# Patient Record
Sex: Male | Born: 1955 | Race: White | Hispanic: No | Marital: Single | Smoking: Former smoker
Health system: Southern US, Community
[De-identification: ages and names within clinical notes are randomized; demographics above are authoritative.]

## PROBLEM LIST (undated history)

## (undated) DIAGNOSIS — G4700 Insomnia, unspecified: Secondary | ICD-10-CM

## (undated) DIAGNOSIS — F909 Attention-deficit hyperactivity disorder, unspecified type: Secondary | ICD-10-CM

## (undated) DIAGNOSIS — F1020 Alcohol dependence, uncomplicated: Secondary | ICD-10-CM

## (undated) DIAGNOSIS — I10 Essential (primary) hypertension: Secondary | ICD-10-CM

## (undated) HISTORY — PX: KNEE SURGERY: SHX244

## (undated) HISTORY — DX: Alcohol dependence, uncomplicated: F10.20

## (undated) HISTORY — DX: Attention-deficit hyperactivity disorder, unspecified type: F90.9

## (undated) HISTORY — DX: Insomnia, unspecified: G47.00

## (undated) HISTORY — DX: Essential (primary) hypertension: I10

---

## 2012-10-15 ENCOUNTER — Emergency Department: Payer: Self-pay | Admitting: Emergency Medicine

## 2012-10-15 LAB — COMPREHENSIVE METABOLIC PANEL
Alkaline Phosphatase: 87 U/L (ref 50–136)
Anion Gap: 8 (ref 7–16)
BUN: 10 mg/dL (ref 7–18)
Chloride: 105 mmol/L (ref 98–107)
Co2: 24 mmol/L (ref 21–32)
Creatinine: 0.86 mg/dL (ref 0.60–1.30)
EGFR (African American): >60
EGFR (Non-African Amer.): >60
Glucose: 96 mg/dL (ref 65–99)
Osmolality: 273 (ref 275–301)
Potassium: 3.8 mmol/L (ref 3.5–5.1)
SGOT(AST): 81 U/L — ABNORMAL HIGH (ref 15–37)
SGPT (ALT): 70 U/L (ref 12–78)
Sodium: 137 mmol/L (ref 136–145)
Total Protein: 7 g/dL (ref 6.4–8.2)

## 2012-10-15 LAB — CBC
MCV: 99 fL (ref 80–100)
RBC: 4.06 10*6/uL — ABNORMAL LOW (ref 4.40–5.90)
RDW: 13.1 % (ref 11.5–14.5)
WBC: 3.3 10*3/uL — ABNORMAL LOW (ref 3.8–10.6)

## 2015-05-16 ENCOUNTER — Ambulatory Visit: Payer: Self-pay | Admitting: Family Medicine

## 2015-05-19 ENCOUNTER — Encounter: Payer: Self-pay | Admitting: Family Medicine

## 2015-05-19 ENCOUNTER — Ambulatory Visit (INDEPENDENT_AMBULATORY_CARE_PROVIDER_SITE_OTHER): Payer: Self-pay | Admitting: Family Medicine

## 2015-05-19 VITALS — BP 130/80 | HR 62 | Temp 98.0°F | Resp 16 | Ht 71.0 in | Wt 178.0 lb

## 2015-05-19 DIAGNOSIS — I10 Essential (primary) hypertension: Secondary | ICD-10-CM

## 2015-05-19 DIAGNOSIS — F1020 Alcohol dependence, uncomplicated: Secondary | ICD-10-CM | POA: Insufficient documentation

## 2015-05-19 DIAGNOSIS — F909 Attention-deficit hyperactivity disorder, unspecified type: Secondary | ICD-10-CM | POA: Insufficient documentation

## 2015-05-19 DIAGNOSIS — G4700 Insomnia, unspecified: Secondary | ICD-10-CM | POA: Insufficient documentation

## 2015-05-19 MED ORDER — METOPROLOL TARTRATE 25 MG PO TABS: 25.0000 mg | ORAL_TABLET | Freq: Two times a day (BID) | ORAL | Status: AC

## 2015-05-19 MED ORDER — LORAZEPAM 1 MG PO TABS: 1.0000 mg | ORAL_TABLET | Freq: Four times a day (QID) | ORAL | Status: DC | PRN

## 2015-05-19 NOTE — Progress Notes (Signed)
Patient: Bryan Proctor Male    DOB: 1955/11/27   59 y.o.   MRN: [id number] Visit Date: 05/19/2015  Today's Provider: Mila Merry, MD   Chief Complaint  Patient presents with  . Follow-up    4 month  . Hypertension  . Insomnia   Subjective:    HPI   Follow-up for Insomnia from 01/10/2015; no changes were made.   Hypertension, follow-up:  BP Readings from Last 3 Encounters:  05/19/15 130/80  01/10/15 124/80    He was last seen for hypertension 4 months ago.  BP at that visit was 124/80. Management changes since that visit include none. He reports good compliance with treatment. He is not having side effects. none  He is exercising. He is adherent to low salt diet.   Outside blood pressures are none. He is experiencing none.  Patient denies none.   Cardiovascular risk factors include none.  Use of agents associated with hypertension: none.     Weight trend: stable Wt Readings from Last 3 Encounters:  05/19/15 178 lb (80.74 kg)  01/10/15 186 lb (84.369 kg)    Current diet: well balanced   Alcohol abuse, follow up  He states he has been mostly sober since his last visit in April. He states he did not drink at all for about a month long period, but on several occasions drink 3-4 tall beers on the weekends. He has not attended any alcohol abuse support groups lately. He does not lorazepam most nights to help him sleep, but not when he has been drinking.       Allergies  Allergen Reactions  . Penicillins Nausea And Vomiting   Previous Medications   LORAZEPAM (ATIVAN) 1 MG TABLET    Take 1 mg by mouth every 6 (six) hours as needed for anxiety (for insomnia, nervousness, and elevated blood pressure).   METOPROLOL TARTRATE (LOPRESSOR) 25 MG TABLET    Take 25 mg by mouth 2 (two) times daily.    Review of Systems  Constitutional: Negative for fatigue.  Cardiovascular: Positive for leg swelling. Negative for chest pain and palpitations.  Neurological:  Negative for dizziness, light-headedness and headaches.  Psychiatric/Behavioral: Positive for sleep disturbance.    Social History  Substance Use Topics  . Smoking status: Former Games developer  . Smokeless tobacco: Not on file  . Alcohol Use: 0.0 oz/week    0 Standard drinks or equivalent per week     Comment: Previous Heavy alcohol use, drank 6-12 beers daily. Quit drinking 01/04/2015   Objective:   BP 130/80 mmHg  Pulse 62  Temp(Src) 98 F (36.7 C) (Oral)  Resp 16  Ht 5\' 11"  (1.803 m)  Wt 178 lb (80.74 kg)  BMI 24.84 kg/m2  Physical Exam  General appearance: alert, well developed, well nourished, cooperative and in no distress Head: Normocephalic, without obvious abnormality, atraumatic Lungs: Respirations even and unlabored Extremities: No gross deformities Skin: Skin color, texture, turgor normal. No rashes seen  Psych: Appropriate mood and affect. Neurologic: Mental status: Alert, oriented to person, place, and time, thought content appropriate.     Assessment & Plan:     1. Alcoholism He states he will get back into AA meetings  2. Essential hypertension Metoprolol working fairly well so long as he remains abstinent of alcohol.  - metoprolol tartrate (LOPRESSOR) 25 MG tablet; Take 1 tablet (25 mg total) by mouth 2 (two) times daily.  Dispense: 60 tablet; Refill: 5  3. Insomnia  -  LORazepam (ATIVAN) 1 MG tablet; Take 1 tablet (1 mg total) by mouth every 6 (six) hours as needed for anxiety (for insomnia, nervousness, and elevated blood pressure).  Dispense: 30 tablet; Refill: 3        Mila Merry, MD  [facility] 130/80

## 2015-10-17 ENCOUNTER — Telehealth: Payer: Self-pay

## 2015-10-17 NOTE — Telephone Encounter (Signed)
Patient called to make an appointment for next week for leg pain, swelling and rash present lower right leg. Patient states this has been going on for 2 weeks, recurrent, not worse but does not look the best where the swelling is and rash. Advised patient he needs to be seen today to get this evaluated before it causes further issues. Patient states he has things to do today and then leaving to go out of town this afternoon. Explained to him my concern but he still wants to wait till next week. Advised patient he needs to seek treatment if symptoms become worse sooner and he understood.-aa

## 2015-10-22 ENCOUNTER — Ambulatory Visit (INDEPENDENT_AMBULATORY_CARE_PROVIDER_SITE_OTHER): Payer: Self-pay | Admitting: Family Medicine

## 2015-10-22 ENCOUNTER — Encounter: Payer: Self-pay | Admitting: Family Medicine

## 2015-10-22 VITALS — BP 118/80 | HR 62 | Temp 98.6°F | Resp 16 | Wt 168.0 lb

## 2015-10-22 DIAGNOSIS — R600 Localized edema: Secondary | ICD-10-CM

## 2015-10-22 NOTE — Progress Notes (Signed)
       Patient: Bryan Proctor Male    DOB: 05-10-1956   60 y.o.   MRN: [id number] Visit Date: 10/22/2015  Today's Provider: Mila Merry, MD   Chief Complaint  Patient presents with  . Leg Swelling    x 2 months   Subjective:    HPI  Leg Swelling:  Patient comes in stating he has had swelling in both legs for several months. Patient feels the swelling is worse in the left leg and if overall worsening. Patient reports pain in his feet and numbness on the bottom of his feet. Patient has tried elevating his legs while watching TV. Patient states a rash appeared on his left leg 2-3 weeks ago. Rash was painful at first, but not now. He denies any chest pains or shortness of breath, but has been very fatigued lately.  He states he has mostly stopped drinking alcohol. He drink half a beer last weekend but wasn't able to hold it down. Before that, the last time he drank alcohol was new years eve.     Allergies  Allergen Reactions  . Penicillins Nausea And Vomiting   Previous Medications   LORAZEPAM (ATIVAN) 1 MG TABLET    Take 1 tablet (1 mg total) by mouth every 6 (six) hours as needed for anxiety (for insomnia, nervousness, and elevated blood pressure).   METOPROLOL TARTRATE (LOPRESSOR) 25 MG TABLET    Take 1 tablet (25 mg total) by mouth 2 (two) times daily.    Review of Systems  Constitutional: Negative for fever, chills, diaphoresis, appetite change and fatigue.  Respiratory: Negative for chest tightness, shortness of breath and wheezing.   Cardiovascular: Negative for chest pain and palpitations.  Gastrointestinal: Negative for nausea, vomiting and abdominal pain.  Neurological: Positive for light-headedness and numbness (at the bottom of his feet). Negative for dizziness and facial asymmetry.    Social History  Substance Use Topics  . Smoking status: Former Games developer  . Smokeless tobacco: Not on file  . Alcohol Use: 0.0 oz/week    0 Standard drinks or equivalent per week   Comment: Previous Heavy alcohol use, drank 6-12 beers daily. Quit drinking 01/04/2015.    Objective:   BP 118/80 mmHg  Pulse 62  Temp(Src) 98.6 F (37 C) (Oral)  Resp 16  Wt 168 lb (76.204 kg)  SpO2 97%  Physical Exam   General Appearance:    Alert, cooperative, no distress  Eyes:    PERRL, conjunctiva/corneas clear, EOM's intact       Lungs:     Clear to auscultation bilaterally, respirations unlabored  Heart:    Regular rate and rhythm, no murmurs rubs or gallops.   Neurologic:   Awake, alert, oriented x 3. No apparent focal neurological           defect.   Extremities:   3+ pitting edema left foot and ankle, 2+ on right. No erythema. Not warm to touch. No other lesions. Few minor varicosities.        Assessment & Plan:     1. Localized edema May be exacerbated by beta-blocker. No s/s of cardiac dysfunction.  Check labs.   - Comprehensive metabolic panel - CBC - TSH       Mila Merry, MD  [facility]

## 2015-10-23 ENCOUNTER — Telehealth: Payer: Self-pay

## 2015-10-23 DIAGNOSIS — R609 Edema, unspecified: Secondary | ICD-10-CM

## 2015-10-23 LAB — CBC
Hematocrit: 35.7 % — ABNORMAL LOW (ref 37.5–51.0)
Hemoglobin: 12 g/dL — ABNORMAL LOW (ref 12.6–17.7)
MCH: 34.1 pg — ABNORMAL HIGH (ref 26.6–33.0)
MCHC: 33.6 g/dL (ref 31.5–35.7)
MCV: 101 fL — ABNORMAL HIGH (ref 79–97)
PLATELETS: 172 10*3/uL (ref 150–379)
RBC: 3.52 x10E6/uL — ABNORMAL LOW (ref 4.14–5.80)
RDW: 14.9 % (ref 12.3–15.4)
WBC: 3.7 10*3/uL (ref 3.4–10.8)

## 2015-10-23 LAB — COMPREHENSIVE METABOLIC PANEL
ALT: 91 IU/L — ABNORMAL HIGH (ref 0–44)
AST: 146 IU/L — ABNORMAL HIGH (ref 0–40)
Albumin/Globulin Ratio: 1.1 (ref 1.1–2.5)
Albumin: 3.4 g/dL — ABNORMAL LOW (ref 3.5–5.5)
Alkaline Phosphatase: 166 IU/L — ABNORMAL HIGH (ref 39–117)
BILIRUBIN TOTAL: 1.1 mg/dL (ref 0.0–1.2)
BUN/Creatinine Ratio: 4 — ABNORMAL LOW (ref 9–20)
BUN: 4 mg/dL — ABNORMAL LOW (ref 6–24)
CHLORIDE: 94 mmol/L — AB (ref 96–106)
CO2: 25 mmol/L (ref 18–29)
Calcium: 9.1 mg/dL (ref 8.7–10.2)
Creatinine, Ser: 0.89 mg/dL (ref 0.76–1.27)
GFR calc Af Amer: 109 mL/min/{1.73_m2} (ref >59)
GFR calc non Af Amer: 94 mL/min/{1.73_m2} (ref >59)
GLUCOSE: 122 mg/dL — AB (ref 65–99)
Globulin, Total: 3.1 g/dL (ref 1.5–4.5)
POTASSIUM: 3.7 mmol/L (ref 3.5–5.2)
SODIUM: 134 mmol/L (ref 134–144)
TOTAL PROTEIN: 6.5 g/dL (ref 6.0–8.5)

## 2015-10-23 LAB — TSH: TSH: 1.47 u[IU]/mL (ref 0.450–4.500)

## 2015-10-23 NOTE — Telephone Encounter (Signed)
Left message to call back  

## 2015-10-23 NOTE — Telephone Encounter (Signed)
-----   Message from Malva Limes, MD sent at 10/23/2015  7:47 AM EST ----- Liver functions are elevated, blood cell count is low, and albumin levels are low. These are all signs of alcoholic liver disease, and cause fluid retention and swelling. He needs to stop drinking alcohol completely. He needs to start thiamine OTC  daily. OTC Folic acid 1 milligram daily. For swelling can take Rx spironolactone  once a day as needed, #30, rf x 1. Follow up office visit in 2-3 weeks.

## 2015-10-23 NOTE — Telephone Encounter (Signed)
LMOVM for pt to return call 

## 2015-10-29 NOTE — Telephone Encounter (Signed)
LMTCB

## 2015-10-30 MED ORDER — SPIRONOLACTONE 50 MG PO TABS: 50.0000 mg | ORAL_TABLET | Freq: Every day | ORAL | Status: DC | PRN

## 2015-10-30 NOTE — Telephone Encounter (Signed)
Patient advised as below and verbally voiced understanding. Prescription sent into pharmacy. Patient states he will call back to schedule follow up appointment.

## 2015-11-17 ENCOUNTER — Encounter: Payer: Self-pay | Admitting: Family Medicine

## 2015-11-17 ENCOUNTER — Ambulatory Visit (INDEPENDENT_AMBULATORY_CARE_PROVIDER_SITE_OTHER): Payer: Self-pay | Admitting: Family Medicine

## 2015-11-17 VITALS — BP 120/80 | HR 89 | Temp 98.7°F | Resp 16 | Ht 71.0 in | Wt 161.0 lb

## 2015-11-17 DIAGNOSIS — K709 Alcoholic liver disease, unspecified: Secondary | ICD-10-CM

## 2015-11-17 DIAGNOSIS — G4700 Insomnia, unspecified: Secondary | ICD-10-CM

## 2015-11-17 MED ORDER — DIAZEPAM 5 MG PO TABS: 5.0000 mg | ORAL_TABLET | Freq: Every evening | ORAL | Status: AC | PRN

## 2015-11-17 NOTE — Patient Instructions (Addendum)
Do not ever drink alcohol   Take  thiamine vitamin every day   You should start going to support group such as alcoholic's anonymous   Alcohol Abuse and Nutrition Alcohol abuse is any pattern of alcohol consumption that harms your health, relationships, or work. Alcohol abuse can affect how your body breaks down and absorbs nutrients from food by causing your liver to work abnormally. Additionally, many people who abuse alcohol do not eat enough carbohydrates, protein, fat, vitamins, and minerals. This can cause poor nutrition (malnutrition) and a lack of nutrients (nutrient deficiencies), which can lead to further complications. Nutrients that are commonly lacking (deficient) among people who abuse alcohol include:  Vitamins.  Vitamin A. This is stored in your liver. It is important for your vision, metabolism, and ability to fight off infections (immunity).  B vitamins. These include vitamins such as folate, thiamin, and niacin. These are important in new cell growth and maintenance.  Vitamin C. This plays an important role in iron absorption, wound healing, and immunity.  Vitamin D. This is produced by your liver, but you can also get vitamin D from food. Vitamin D is necessary for your body to absorb and use calcium.  Minerals.  Calcium. This is important for your bones and your heart and blood vessel (cardiovascular) function.  Iron. This is important for blood, muscle, and nervous system functioning.  Magnesium. This plays an important role in muscle and nerve function, and it helps to control blood sugar and blood pressure.  Zinc. This is important for the normal function of your nervous system and digestive system (gastrointestinal tract). Nutrition is an essential component of therapy for alcohol abuse. Your health care provider or dietitian will work with you to design a plan that can help restore nutrients to your body and prevent potential complications. WHAT IS MY  PLAN? Your dietitian may develop a specific diet plan that is based on your condition and any other complications you may have. A diet plan will commonly include:  A balanced diet.  Grains: 6-8 oz per day.  Vegetables: 2-3 cups per day.  Fruits: 1-2 cups per day.  Meat and other protein: 5-6 oz per day.  Dairy: 2-3 cups per day.  Vitamin and mineral supplements. WHAT DO I NEED TO KNOW ABOUT ALCOHOL AND NUTRITION?  Consume foods that are high in antioxidants, such as grapes, berries, nuts, green tea, and dark green and orange vegetables. This can help to counteract some of the stress that is placed on your liver by consuming alcohol.  Avoid food and drinks that are high in fat and sugar. Foods such as sugared soft drinks, salty snack foods, and candy contain empty calories. This means that they lack important nutrients such as protein, fiber, and vitamins.  Eat frequent meals and snacks. Try to eat 5-6 small meals each day.  Eat a variety of fresh fruits and vegetables each day. This will help you get plenty of water, fiber, and vitamins in your diet.  Drink plenty of water and other clear fluids. Try to drink at least 48-64 oz (1.5-2 L) of water per day.  If you are a vegetarian, eat a variety of protein-rich foods. Pair whole grains with plant-based proteins at meals and snacks to obtain the greatest nutrient benefit from your food. For example, eat rice with beans, put peanut butter on whole-grain toast, or eat oatmeal with sunflower seeds.  Soak beans and whole grains overnight before cooking. This can help your body to  absorb the nutrients more easily.  Include foods fortified with vitamins and minerals in your diet. Commonly fortified foods include milk, orange juice, cereal, and bread.  If you are malnourished, your dietitian may recommend a high-protein, high-calorie diet. This may include:  2,000-3,000 calories (kilocalories) per day.  70-100 grams of protein per  day.  Your health care provider may recommend a complete nutritional supplement beverage. This can help to restore calories, protein, and vitamins to your body. Depending on your condition, you may be advised to consume this instead of or in addition to meals.  Limit your intake of caffeine. Replace drinks like coffee and black tea with decaffeinated coffee and herbal tea.  Eat a variety of foods that are high in omega fatty acids. These include fish, nuts and seeds, and soybeans. These foods may help your liver to recover and may also stabilize your mood.  Certain medicines may cause changes in your appetite, taste, and weight. Work with your health care provider and dietitian to make any adjustments to your medicines and diet plan.  Include other healthy lifestyle choices in your daily routine.  Be physically active.  Get enough sleep.  Spend time doing activities that you enjoy.  If you are unable to take in enough food and calories by mouth, your health care provider may recommend a feeding tube. This is a tube that passes through your nose and throat, directly into your stomach. Nutritional supplement beverages can be given to you through the feeding tube to help you get the nutrients you need.  Take vitamin or mineral supplements as recommended by your health care provider. WHAT FOODS CAN I EAT? Grains Enriched pasta. Enriched rice. Fortified whole-grain bread. Fortified whole-grain cereal. Barley. Brown rice. Quinoa. Millet. Vegetables All fresh, frozen, and canned vegetables. Spinach. Kale. Artichoke. Carrots. Winter squash and pumpkin. Sweet potatoes. Broccoli. Cabbage. Cucumbers. Tomatoes. Sweet peppers. Green beans. Peas. Corn. Fruits All fresh and frozen fruits. Berries. Grapes. Mango. Papaya. Guava. Cherries. Apples. Bananas. Peaches. Plums. Pineapple. Watermelon. Cantaloupe. Oranges. Avocado. Meats and Other Protein Sources Beef liver. Lean beef. Pork. Fresh and canned  chicken. Fresh fish. Oysters. Sardines. Canned tuna. Shrimp. Eggs with yolks. Nuts and seeds. Peanut butter. Beans and lentils. Soybeans. Tofu. Dairy Whole, low-fat, and nonfat milk. Whole, low-fat, and nonfat yogurt. Cottage cheese. Sour cream. Hard and soft cheeses. Beverages Water. Herbal tea. Decaffeinated coffee. Decaffeinated green tea. 100% fruit juice. 100% vegetable juice. Instant breakfast shakes. Condiments Ketchup. Mayonnaise. Mustard. Salad dressing. Barbecue sauce. Sweets and Desserts Sugar-free ice cream. Sugar-free pudding. Sugar-free gelatin. Fats and Oils Butter. Vegetable oil, flaxseed oil, olive oil, and walnut oil. Other Complete nutrition shakes. Protein bars. Sugar-free gum. The items listed above may not be a complete list of recommended foods or beverages. Contact your dietitian for more options. WHAT FOODS ARE NOT RECOMMENDED? Grains Sugar-sweetened breakfast cereals. Flavored instant oatmeal. Fried breads. Vegetables Breaded or deep-fried vegetables. Fruits Dried fruit with added sugar. Candied fruit. Canned fruit in syrup. Meats and Other Protein Sources Breaded or deep-fried meats. Dairy Flavored milks. Fried cheese curds or fried cheese sticks. Beverages Alcohol. Sugar-sweetened soft drinks. Sugar-sweetened tea. Caffeinated coffee and tea. Condiments Sugar. Honey. Agave nectar. Molasses. Sweets and Desserts Chocolate. Cake. Cookies. Candy. Other Potato chips. Pretzels. Salted nuts. Candied nuts. The items listed above may not be a complete list of foods and beverages to avoid. Contact your dietitian for more information.   This information is not intended to replace advice given to you by your health  care provider. Make sure you discuss any questions you have with your health care provider.   Document Released: 07/15/2005 Document Revised: 10/11/2014 Document Reviewed: 04/23/2014 Elsevier Interactive Patient Education [employer]

## 2015-11-17 NOTE — Progress Notes (Signed)
Patient: Bryan Proctor Male    DOB: 1956-09-28   60 y.o.   MRN: [id number] Visit Date: 11/17/2015  Today's Provider: Mila Merry, MD   Chief Complaint  Patient presents with  . Follow-up  . Leg Swelling   Subjective:    HPI  Follow-up for edema from 10/22/2015; labs ordered. Showing liver functions elevated, blood cell count is low, and albumin levels are low Patient advised to stop drinking alcohol completely. Patient to start thiamine OTC  daily. OTC Folic acid 1 milligram daily and spironolactone  once a day as needed, but he did not pick up prescription and has not started vitamins. He states he has not had any alcohol for the last 2 weeks and swelling has since resolved.   CMP Latest Ref Rng 10/22/2015 10/15/2012  Glucose 65 - 99 mg/dL 409(W) 96  BUN 6 - 24 mg/dL 4(L) 10  Creatinine 1.19 - 1.27 mg/dL 1.47 8.29  Sodium 562 - 144 mmol/L 134 137  Potassium 3.5 - 5.2 mmol/L 3.7 3.8  Chloride 96 - 106 mmol/L 94(L) 105  CO2 18 - 29 mmol/L 25 24  Calcium 8.7 - 10.2 mg/dL 9.1 8.8  Total Protein 6.0 - 8.5 g/dL 6.5 7.0  Total Bilirubin 0.0 - 1.2 mg/dL 1.1 0.3  Alkaline Phos 39 - 117 IU/L 166(H) 87  AST 0 - 40 IU/L 146(H) 81(H)  ALT 0 - 44 IU/L 91(H) 70     Allergies  Allergen Reactions  . Penicillins Nausea And Vomiting   Previous Medications   FOLIC ACID (FOLVITE) 1 MG TABLET    Take 1 mg by mouth daily. Reported on 11/17/2015   LORAZEPAM (ATIVAN) 1 MG TABLET    Take 1 tablet (1 mg total) by mouth every 6 (six) hours as needed for anxiety (for insomnia, nervousness, and elevated blood pressure).   METOPROLOL TARTRATE (LOPRESSOR) 25 MG TABLET    Take 1 tablet (25 mg total) by mouth 2 (two) times daily.   SPIRONOLACTONE (ALDACTONE) 50 MG TABLET    Take 1 tablet (50 mg total) by mouth daily as needed.   THIAMINE 100 MG TABLET    Take 100 mg by mouth daily. Reported on 11/17/2015    Review of Systems  Constitutional: Negative for fever, chills and appetite change.    Respiratory: Negative for chest tightness, shortness of breath and wheezing.   Cardiovascular: Positive for leg swelling. Negative for chest pain and palpitations.  Gastrointestinal: Negative for nausea, vomiting and abdominal pain.  Psychiatric/Behavioral: Positive for sleep disturbance.       Having trouble falling asleep and staying asleep    Social History  Substance Use Topics  . Smoking status: Former Games developer  . Smokeless tobacco: Not on file  . Alcohol Use: 0.0 oz/week    0 Standard drinks or equivalent per week     Comment: Previous Heavy alcohol use, drank 6-12 beers daily. Quit drinking 01/04/2015.    Objective:   BP 120/80 mmHg  Pulse 89  Temp(Src) 98.7 F (37.1 C) (Oral)  Resp 16  Ht  (1.803 m)  Wt 161 lb (73.029 kg)  BMI 22.46 kg/m2  SpO2 99%  Physical Exam   General Appearance:    Alert, cooperative, no distress  Eyes:    PERRL, conjunctiva/corneas clear, EOM's intact       Lungs:     Clear to auscultation bilaterally, respirations unlabored  Heart:    Regular rate and rhythm  Ext:  1+ edema left lower leg.           Assessment & Plan:     1. Insomnia He states lorazepam is not effective. Will change to  diazepam to take every 2-3 nights as needed.   2. Alcoholic liver disease (HCC) Strongly counseled that he cannot drink alcohol at all or he will develop terminal liver disease. Strongly encouraged to begin attending AA meetings.   Advised that he should have ultrasound for further evaluation of liver, but he does not currently have insurance.    Follow up 3 months.        Mila Merry, MD  [facility]

## 2015-12-17 ENCOUNTER — Other Ambulatory Visit: Payer: Self-pay | Admitting: Family Medicine

## 2015-12-17 NOTE — Telephone Encounter (Signed)
Please call in lorazepam.  

## 2015-12-18 NOTE — Telephone Encounter (Signed)
Rx called in to pharmacy. 

## 2016-02-10 ENCOUNTER — Ambulatory Visit: Payer: Self-pay | Admitting: Family Medicine

## 2016-07-14 ENCOUNTER — Ambulatory Visit: Payer: Self-pay | Admitting: Family Medicine

## 2016-08-06 ENCOUNTER — Ambulatory Visit: Payer: Self-pay | Admitting: Family Medicine

## 2016-08-17 ENCOUNTER — Ambulatory Visit: Payer: Self-pay | Admitting: Family Medicine

## 2016-08-20 ENCOUNTER — Encounter: Payer: Self-pay | Admitting: Family Medicine

## 2016-08-20 ENCOUNTER — Ambulatory Visit (INDEPENDENT_AMBULATORY_CARE_PROVIDER_SITE_OTHER): Payer: Self-pay | Admitting: Family Medicine

## 2016-08-20 VITALS — BP 142/84 | HR 116 | Temp 97.8°F | Resp 16 | Ht 70.0 in | Wt 202.0 lb

## 2016-08-20 DIAGNOSIS — F1020 Alcohol dependence, uncomplicated: Secondary | ICD-10-CM

## 2016-08-20 DIAGNOSIS — K429 Umbilical hernia without obstruction or gangrene: Secondary | ICD-10-CM

## 2016-08-20 DIAGNOSIS — R1900 Intra-abdominal and pelvic swelling, mass and lump, unspecified site: Secondary | ICD-10-CM

## 2016-08-20 MED ORDER — SPIRONOLACTONE 50 MG PO TABS: 50.0000 mg | ORAL_TABLET | Freq: Every day | ORAL | 0 refills | Status: AC

## 2016-08-20 NOTE — Progress Notes (Signed)
       Patient: Bryan Proctor Male    DOB: [birth year]   60 y.o.   MRN: [id number] Visit Date: 08/20/2016  Today's Provider: Mila Merryonald Fisher, MD   Chief Complaint  Patient presents with  . Hernia    X 1 week.    Subjective:    HPI Patient comes in today c/o of an umbilical hernia. Patient reports that it appeared quickly over the last couple of weeks and is getting large Patient reports that he does have tenderness and discoloration where the hernia is located. Has also noted gradually worsening abdominal distention over the last several months. He has long history of alcohol abuse. He admits to drinking periodically, but not heavily anymore.     Allergies  Allergen Reactions  . Penicillins Nausea And Vomiting     Current Outpatient Prescriptions:  .  diazepam (VALIUM) 5 MG tablet, Take 1 tablet (5 mg total) by mouth at bedtime as needed for anxiety. (Patient not taking: Reported on 08/20/2016), Disp: 30 tablet, Rfl: 0 .  folic acid (FOLVITE) 1 MG tablet, Take 1 mg by mouth daily. Reported on 11/17/2015, Disp: , Rfl:  .  LORazepam (ATIVAN) 1 MG tablet, TAKE ONE TABLET BY MOUTH EVERY 6 HOURS AS NEEDED FOR INSOMNIA, NERVOUSNESS AND ELEVATED BLOOD PRESSURE (Patient not taking: Reported on 08/20/2016), Disp: 30 tablet, Rfl: 2 .  metoprolol tartrate (LOPRESSOR) 25 MG tablet, Take 1 tablet (25 mg total) by mouth 2 (two) times daily. (Patient not taking: Reported on 08/20/2016), Disp: 60 tablet, Rfl: 5 .  thiamine 100 MG tablet, Take 100 mg by mouth daily. Reported on 11/17/2015, Disp: , Rfl:   Review of Systems  Constitutional: Negative.   Respiratory: Negative.   Cardiovascular: Negative.   Gastrointestinal: Positive for abdominal distention and abdominal pain. Negative for anal bleeding, blood in stool, constipation, diarrhea, nausea, rectal pain and vomiting.  Psychiatric/Behavioral: Negative.     Social History  Substance Use Topics  . Smoking status: Former Games developermoker  . Smokeless  tobacco: Not on file  . Alcohol use 0.0 oz/week     Comment: Previous Heavy alcohol use, drank 6-12 beers daily. Quit drinking 01/04/2015.    Objective:   BP (!) 142/84 (BP Location: Left Arm, Patient Position: Sitting, Cuff Size: Normal)   Pulse (!) 116   Temp 97.8 F (36.6 C)   Resp 16   Ht 5\' 10"  (1.778 m)   Wt 202 lb (91.6 kg)   BMI 28.98 kg/m   Physical Exam  Moderately distended non-tender abdomen.  Large slightly tender umbilical hernia noted. No erythema.     Assessment & Plan:     1. Umbilical hernia without obstruction and without gangrene Rapid onset likely due to underlying abdominal distention as below.   2. 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 Will try outpatient spironolactone, but advised patient that if swelling does not come down quickly or if he has any increase In pain he will need to go to the Emergency Room, likely requiring paracentesis.   - spironolactone (ALDACTONE) 50 MG tablet; Take 1 tablet (50 mg total) by mouth daily.  Dispense: 30 tablet; Refill: 0 - US Abdomen Complete; Future  3. Alcoholism (HCC)        Mila Merryonald Fisher, MD  [facility]

## 2016-08-24 ENCOUNTER — Ambulatory Visit: Payer: Self-pay

## 2016-09-27 ENCOUNTER — Encounter: Payer: Self-pay | Admitting: Emergency Medicine

## 2016-09-27 ENCOUNTER — Emergency Department: Payer: 59

## 2016-09-27 ENCOUNTER — Inpatient Hospital Stay
Admission: EM | Admit: 2016-09-27 | Discharge: 2016-10-04 | DRG: 871 | Disposition: E | Payer: 59 | Attending: Pulmonary Disease | Admitting: Pulmonary Disease

## 2016-09-27 DIAGNOSIS — I83223 Varicose veins of left lower extremity with both ulcer of ankle and inflammation: Secondary | ICD-10-CM | POA: Diagnosis present

## 2016-09-27 DIAGNOSIS — M868X7 Other osteomyelitis, ankle and foot: Secondary | ICD-10-CM | POA: Diagnosis present

## 2016-09-27 DIAGNOSIS — J690 Pneumonitis due to inhalation of food and vomit: Secondary | ICD-10-CM | POA: Diagnosis present

## 2016-09-27 DIAGNOSIS — R6521 Severe sepsis with septic shock: Secondary | ICD-10-CM | POA: Diagnosis present

## 2016-09-27 DIAGNOSIS — G4700 Insomnia, unspecified: Secondary | ICD-10-CM | POA: Diagnosis present

## 2016-09-27 DIAGNOSIS — R188 Other ascites: Secondary | ICD-10-CM

## 2016-09-27 DIAGNOSIS — Z66 Do not resuscitate: Secondary | ICD-10-CM | POA: Diagnosis not present

## 2016-09-27 DIAGNOSIS — G9341 Metabolic encephalopathy: Secondary | ICD-10-CM | POA: Diagnosis present

## 2016-09-27 DIAGNOSIS — K117 Disturbances of salivary secretion: Secondary | ICD-10-CM

## 2016-09-27 DIAGNOSIS — D684 Acquired coagulation factor deficiency: Secondary | ICD-10-CM | POA: Diagnosis present

## 2016-09-27 DIAGNOSIS — D62 Acute posthemorrhagic anemia: Secondary | ICD-10-CM | POA: Diagnosis present

## 2016-09-27 DIAGNOSIS — Z515 Encounter for palliative care: Secondary | ICD-10-CM | POA: Diagnosis not present

## 2016-09-27 DIAGNOSIS — F1010 Alcohol abuse, uncomplicated: Secondary | ICD-10-CM | POA: Diagnosis present

## 2016-09-27 DIAGNOSIS — F909 Attention-deficit hyperactivity disorder, unspecified type: Secondary | ICD-10-CM | POA: Diagnosis present

## 2016-09-27 DIAGNOSIS — R40243 Glasgow coma scale score 3-8, unspecified time: Secondary | ICD-10-CM | POA: Diagnosis not present

## 2016-09-27 DIAGNOSIS — R578 Other shock: Secondary | ICD-10-CM | POA: Diagnosis present

## 2016-09-27 DIAGNOSIS — Z452 Encounter for adjustment and management of vascular access device: Secondary | ICD-10-CM

## 2016-09-27 DIAGNOSIS — K7031 Alcoholic cirrhosis of liver with ascites: Secondary | ICD-10-CM | POA: Diagnosis not present

## 2016-09-27 DIAGNOSIS — K922 Gastrointestinal hemorrhage, unspecified: Secondary | ICD-10-CM | POA: Diagnosis not present

## 2016-09-27 DIAGNOSIS — Z79899 Other long term (current) drug therapy: Secondary | ICD-10-CM

## 2016-09-27 DIAGNOSIS — K7290 Hepatic failure, unspecified without coma: Secondary | ICD-10-CM

## 2016-09-27 DIAGNOSIS — R680 Hypothermia, not associated with low environmental temperature: Secondary | ICD-10-CM | POA: Diagnosis present

## 2016-09-27 DIAGNOSIS — Z8249 Family history of ischemic heart disease and other diseases of the circulatory system: Secondary | ICD-10-CM

## 2016-09-27 DIAGNOSIS — K921 Melena: Secondary | ICD-10-CM

## 2016-09-27 DIAGNOSIS — D696 Thrombocytopenia, unspecified: Secondary | ICD-10-CM | POA: Diagnosis present

## 2016-09-27 DIAGNOSIS — K560 Paralytic ileus: Secondary | ICD-10-CM | POA: Diagnosis present

## 2016-09-27 DIAGNOSIS — A419 Sepsis, unspecified organism: Secondary | ICD-10-CM | POA: Diagnosis present

## 2016-09-27 DIAGNOSIS — R140 Abdominal distension (gaseous): Secondary | ICD-10-CM

## 2016-09-27 DIAGNOSIS — K7210 Chronic hepatic failure without coma: Secondary | ICD-10-CM

## 2016-09-27 DIAGNOSIS — R601 Generalized edema: Secondary | ICD-10-CM

## 2016-09-27 DIAGNOSIS — J9600 Acute respiratory failure, unspecified whether with hypoxia or hypercapnia: Secondary | ICD-10-CM | POA: Diagnosis present

## 2016-09-27 DIAGNOSIS — E872 Acidosis: Secondary | ICD-10-CM | POA: Diagnosis present

## 2016-09-27 DIAGNOSIS — Z880 Allergy status to penicillin: Secondary | ICD-10-CM

## 2016-09-27 DIAGNOSIS — Z4659 Encounter for fitting and adjustment of other gastrointestinal appliance and device: Secondary | ICD-10-CM

## 2016-09-27 DIAGNOSIS — R7881 Bacteremia: Secondary | ICD-10-CM | POA: Diagnosis not present

## 2016-09-27 DIAGNOSIS — R609 Edema, unspecified: Secondary | ICD-10-CM

## 2016-09-27 DIAGNOSIS — R6520 Severe sepsis without septic shock: Secondary | ICD-10-CM | POA: Diagnosis not present

## 2016-09-27 DIAGNOSIS — E871 Hypo-osmolality and hyponatremia: Secondary | ICD-10-CM | POA: Diagnosis present

## 2016-09-27 DIAGNOSIS — L97328 Non-pressure chronic ulcer of left ankle with other specified severity: Secondary | ICD-10-CM | POA: Diagnosis present

## 2016-09-27 DIAGNOSIS — N179 Acute kidney failure, unspecified: Secondary | ICD-10-CM | POA: Diagnosis present

## 2016-09-27 DIAGNOSIS — J9690 Respiratory failure, unspecified, unspecified whether with hypoxia or hypercapnia: Secondary | ICD-10-CM

## 2016-09-27 DIAGNOSIS — T68XXXA Hypothermia, initial encounter: Secondary | ICD-10-CM | POA: Diagnosis present

## 2016-09-27 DIAGNOSIS — Z87891 Personal history of nicotine dependence: Secondary | ICD-10-CM

## 2016-09-27 DIAGNOSIS — K7460 Unspecified cirrhosis of liver: Secondary | ICD-10-CM | POA: Diagnosis present

## 2016-09-27 DIAGNOSIS — R4182 Altered mental status, unspecified: Secondary | ICD-10-CM

## 2016-09-27 DIAGNOSIS — I10 Essential (primary) hypertension: Secondary | ICD-10-CM | POA: Diagnosis present

## 2016-09-27 DIAGNOSIS — R34 Anuria and oliguria: Secondary | ICD-10-CM | POA: Diagnosis present

## 2016-09-27 DIAGNOSIS — K920 Hematemesis: Secondary | ICD-10-CM

## 2016-09-27 DIAGNOSIS — K7291 Hepatic failure, unspecified with coma: Secondary | ICD-10-CM | POA: Diagnosis present

## 2016-09-27 DIAGNOSIS — E8770 Fluid overload, unspecified: Secondary | ICD-10-CM | POA: Diagnosis present

## 2016-09-27 DIAGNOSIS — J9601 Acute respiratory failure with hypoxia: Secondary | ICD-10-CM | POA: Diagnosis not present

## 2016-09-27 DIAGNOSIS — B9620 Unspecified Escherichia coli [E. coli] as the cause of diseases classified elsewhere: Secondary | ICD-10-CM | POA: Diagnosis present

## 2016-09-27 LAB — PROTIME-INR
INR: 1.63
INR: 2.08
PROTHROMBIN TIME: 19.5 s — AB (ref 11.4–15.2)
Prothrombin Time: 23.7 seconds — ABNORMAL HIGH (ref 11.4–15.2)

## 2016-09-27 LAB — BLOOD GAS, ARTERIAL
Acid-base deficit: 4.9 mmol/L — ABNORMAL HIGH (ref 0.0–2.0)
BICARBONATE: 18.8 mmol/L — AB (ref 20.0–28.0)
FIO2: 21
O2 Saturation: 97.4 %
PCO2 ART: 29 mmHg — AB (ref 32.0–48.0)
PO2 ART: 94 mmHg (ref 83.0–108.0)
Patient temperature: 37
pH, Arterial: 7.42 (ref 7.350–7.450)

## 2016-09-27 LAB — LIPASE, BLOOD: Lipase: 71 U/L — ABNORMAL HIGH (ref 11–51)

## 2016-09-27 LAB — COMPREHENSIVE METABOLIC PANEL
ALBUMIN: 2 g/dL — AB (ref 3.5–5.0)
ALK PHOS: 80 U/L (ref 38–126)
ALT: 31 U/L (ref 17–63)
ANION GAP: 11 (ref 5–15)
AST: 77 U/L — AB (ref 15–41)
BILIRUBIN TOTAL: 2.5 mg/dL — AB (ref 0.3–1.2)
BUN: 52 mg/dL — AB (ref 6–20)
CALCIUM: 9.2 mg/dL (ref 8.9–10.3)
CO2: 20 mmol/L — AB (ref 22–32)
Chloride: 98 mmol/L — ABNORMAL LOW (ref 101–111)
Creatinine, Ser: 1.67 mg/dL — ABNORMAL HIGH (ref 0.61–1.24)
GFR calc Af Amer: 50 mL/min — ABNORMAL LOW (ref >60)
GFR calc non Af Amer: 43 mL/min — ABNORMAL LOW (ref >60)
GLUCOSE: 106 mg/dL — AB (ref 65–99)
Potassium: 4.7 mmol/L (ref 3.5–5.1)
SODIUM: 129 mmol/L — AB (ref 135–145)
TOTAL PROTEIN: 6.5 g/dL (ref 6.5–8.1)

## 2016-09-27 LAB — CBC WITH DIFFERENTIAL/PLATELET
BASOS ABS: 0 10*3/uL (ref 0–0.1)
BASOS PCT: 0 %
EOS PCT: 0 %
Eosinophils Absolute: 0 10*3/uL (ref 0–0.7)
HCT: 33.7 % — ABNORMAL LOW (ref 40.0–52.0)
Hemoglobin: 11.9 g/dL — ABNORMAL LOW (ref 13.0–18.0)
Lymphocytes Relative: 11 %
Lymphs Abs: 0.6 10*3/uL — ABNORMAL LOW (ref 1.0–3.6)
MCH: 35.3 pg — ABNORMAL HIGH (ref 26.0–34.0)
MCHC: 35.3 g/dL (ref 32.0–36.0)
MCV: 100.2 fL — ABNORMAL HIGH (ref 80.0–100.0)
MONO ABS: 0.4 10*3/uL (ref 0.2–1.0)
Monocytes Relative: 8 %
NEUTROS ABS: 4.4 10*3/uL (ref 1.4–6.5)
Neutrophils Relative %: 81 %
PLATELETS: 94 10*3/uL — AB (ref 150–440)
RBC: 3.36 MIL/uL — AB (ref 4.40–5.90)
RDW: 15.5 % — AB (ref 11.5–14.5)
WBC: 5.4 10*3/uL (ref 3.8–10.6)

## 2016-09-27 LAB — TSH: TSH: 1.865 u[IU]/mL (ref 0.350–4.500)

## 2016-09-27 LAB — APTT: APTT: 46 s — AB (ref 24–36)

## 2016-09-27 LAB — URINALYSIS, COMPLETE (UACMP) WITH MICROSCOPIC
Bacteria, UA: NONE SEEN
Bilirubin Urine: NEGATIVE
Glucose, UA: NEGATIVE mg/dL
Ketones, ur: NEGATIVE mg/dL
Leukocytes, UA: NEGATIVE
Nitrite: NEGATIVE
PH: 5 (ref 5.0–8.0)
Protein, ur: NEGATIVE mg/dL
SPECIFIC GRAVITY, URINE: 1.016 (ref 1.005–1.030)
SQUAMOUS EPITHELIAL / LPF: NONE SEEN

## 2016-09-27 LAB — URINE DRUG SCREEN, QUALITATIVE (ARMC ONLY)
AMPHETAMINES, UR SCREEN: NOT DETECTED
BENZODIAZEPINE, UR SCRN: NOT DETECTED
Barbiturates, Ur Screen: NOT DETECTED
COCAINE METABOLITE, UR ~~LOC~~: NOT DETECTED
Cannabinoid 50 Ng, Ur ~~LOC~~: NOT DETECTED
MDMA (ECSTASY) UR SCREEN: NOT DETECTED
METHADONE SCREEN, URINE: NOT DETECTED
OPIATE, UR SCREEN: NOT DETECTED
Phencyclidine (PCP) Ur S: NOT DETECTED
Tricyclic, Ur Screen: NOT DETECTED

## 2016-09-27 LAB — CBC
HEMATOCRIT: 26.1 % — AB (ref 40.0–52.0)
HEMOGLOBIN: 9 g/dL — AB (ref 13.0–18.0)
MCH: 34.4 pg — AB (ref 26.0–34.0)
MCHC: 34.7 g/dL (ref 32.0–36.0)
MCV: 99.4 fL (ref 80.0–100.0)
Platelets: 78 10*3/uL — ABNORMAL LOW (ref 150–440)
RBC: 2.62 MIL/uL — ABNORMAL LOW (ref 4.40–5.90)
RDW: 15.9 % — ABNORMAL HIGH (ref 11.5–14.5)
WBC: 3.9 10*3/uL (ref 3.8–10.6)

## 2016-09-27 LAB — LACTIC ACID, PLASMA
LACTIC ACID, VENOUS: 3.2 mmol/L — AB (ref 0.5–1.9)
LACTIC ACID, VENOUS: 2.4 mmol/L — AB (ref 0.5–1.9)

## 2016-09-27 LAB — CK: Total CK: 239 U/L (ref 49–397)

## 2016-09-27 LAB — ETHANOL: Alcohol, Ethyl (B): <5 mg/dL (ref <5)

## 2016-09-27 LAB — SALICYLATE LEVEL: Salicylate Lvl: <7 mg/dL (ref 2.8–30.0)

## 2016-09-27 LAB — TROPONIN I: Troponin I: <0.03 ng/mL (ref <0.03)

## 2016-09-27 LAB — ACETAMINOPHEN LEVEL: Acetaminophen (Tylenol), Serum: <10 ug/mL — ABNORMAL LOW (ref 10–30)

## 2016-09-27 LAB — HEMOGLOBIN AND HEMATOCRIT, BLOOD
HCT: 26.8 % — ABNORMAL LOW (ref 40.0–52.0)
HEMOGLOBIN: 9.4 g/dL — AB (ref 13.0–18.0)

## 2016-09-27 LAB — SEDIMENTATION RATE: Sed Rate: 36 mm/hr — ABNORMAL HIGH (ref 0–20)

## 2016-09-27 LAB — AMMONIA: Ammonia: 127 umol/L — ABNORMAL HIGH (ref 9–35)

## 2016-09-27 LAB — PROCALCITONIN: Procalcitonin: 0.86 ng/mL

## 2016-09-27 LAB — BRAIN NATRIURETIC PEPTIDE: B NATRIURETIC PEPTIDE 5: 80 pg/mL (ref 0.0–100.0)

## 2016-09-27 LAB — MRSA PCR SCREENING: MRSA by PCR: NEGATIVE

## 2016-09-27 MED ORDER — LEVOFLOXACIN IN D5W 750 MG/150ML IV SOLN
750.0000 mg | Freq: Once | INTRAVENOUS | Status: AC
  Administered 2016-09-27: 750 mg via INTRAVENOUS

## 2016-09-27 MED ORDER — PANTOPRAZOLE SODIUM 40 MG IV SOLR
40.0000 mg | Freq: Two times a day (BID) | INTRAVENOUS | Status: DC
  Administered 2016-09-27: 40 mg via INTRAVENOUS
  Filled 2016-09-27: qty 40

## 2016-09-27 MED ORDER — OXYCODONE HCL 5 MG PO TABS: 5.0000 mg | ORAL_TABLET | ORAL | Status: DC | PRN

## 2016-09-27 MED ORDER — RIFAXIMIN 550 MG PO TABS
550.0000 mg | ORAL_TABLET | Freq: Two times a day (BID) | ORAL | Status: DC
  Administered 2016-09-28 – 2016-09-30 (×6): 550 mg via ORAL
  Filled 2016-09-27 (×5): qty 1

## 2016-09-27 MED ORDER — ACETAMINOPHEN 325 MG PO TABS: 650.0000 mg | ORAL_TABLET | Freq: Four times a day (QID) | ORAL | Status: DC | PRN

## 2016-09-27 MED ORDER — DEXTROSE 5 % IV SOLN
2.0000 g | Freq: Once | INTRAVENOUS | Status: AC
  Administered 2016-09-27: 2 g via INTRAVENOUS
  Filled 2016-09-27: qty 2

## 2016-09-27 MED ORDER — SODIUM CHLORIDE 0.9 % IV SOLN
Freq: Once | INTRAVENOUS | Status: AC
  Administered 2016-09-27: 16:00:00 via INTRAVENOUS

## 2016-09-27 MED ORDER — SODIUM CHLORIDE 0.9 % IV BOLUS (SEPSIS)
500.0000 mL | Freq: Once | INTRAVENOUS | Status: AC
  Administered 2016-09-27: 500 mL via INTRAVENOUS

## 2016-09-27 MED ORDER — VANCOMYCIN HCL IN DEXTROSE 1-5 GM/200ML-% IV SOLN
1000.0000 mg | Freq: Once | INTRAVENOUS | Status: AC
  Administered 2016-09-27: 1000 mg via INTRAVENOUS
  Filled 2016-09-27: qty 200

## 2016-09-27 MED ORDER — ACETAMINOPHEN 650 MG RE SUPP: 650.0000 mg | Freq: Four times a day (QID) | RECTAL | Status: DC | PRN

## 2016-09-27 MED ORDER — LACTULOSE 10 GM/15ML PO SOLN
30.0000 g | Freq: Two times a day (BID) | ORAL | Status: DC
  Administered 2016-09-28 – 2016-09-30 (×6): 30 g via ORAL
  Filled 2016-09-27 (×6): qty 60

## 2016-09-27 MED ORDER — SODIUM CHLORIDE 0.9 % IV SOLN
Freq: Once | INTRAVENOUS | Status: AC
  Administered 2016-09-27: 17:00:00 via INTRAVENOUS

## 2016-09-27 MED ORDER — VANCOMYCIN HCL 10 G IV SOLR
1250.0000 mg | INTRAVENOUS | Status: DC
  Administered 2016-09-28 (×2): 1250 mg via INTRAVENOUS
  Filled 2016-09-27 (×3): qty 1250

## 2016-09-27 MED ORDER — LEVOFLOXACIN IN D5W 750 MG/150ML IV SOLN
INTRAVENOUS | Status: AC
  Administered 2016-09-27: 750 mg via INTRAVENOUS
  Filled 2016-09-27: qty 150

## 2016-09-27 MED ORDER — LEVOFLOXACIN IN D5W 750 MG/150ML IV SOLN
750.0000 mg | INTRAVENOUS | Status: DC
  Filled 2016-09-27: qty 150

## 2016-09-27 MED ORDER — LEVOFLOXACIN IN D5W 750 MG/150ML IV SOLN: 750.0000 mg | Freq: Once | INTRAVENOUS | Status: DC

## 2016-09-27 MED ORDER — ONDANSETRON HCL 4 MG PO TABS: 4.0000 mg | ORAL_TABLET | Freq: Four times a day (QID) | ORAL | Status: DC | PRN

## 2016-09-27 MED ORDER — SODIUM CHLORIDE 0.9 % IV BOLUS (SEPSIS)
1000.0000 mL | Freq: Once | INTRAVENOUS | Status: AC
  Administered 2016-09-27: 1000 mL via INTRAVENOUS

## 2016-09-27 MED ORDER — SODIUM CHLORIDE 0.9% FLUSH
3.0000 mL | Freq: Two times a day (BID) | INTRAVENOUS | Status: DC
  Administered 2016-09-27 – 2016-09-30 (×5): 3 mL via INTRAVENOUS

## 2016-09-27 MED ORDER — SODIUM CHLORIDE 0.9 % IV SOLN
INTRAVENOUS | Status: DC
  Administered 2016-09-27 – 2016-09-28 (×2): via INTRAVENOUS

## 2016-09-27 MED ORDER — DEXTROSE 5 % IV SOLN
2.0000 g | Freq: Three times a day (TID) | INTRAVENOUS | Status: DC
  Administered 2016-09-28: 2 g via INTRAVENOUS
  Filled 2016-09-27 (×2): qty 2

## 2016-09-27 MED ORDER — ONDANSETRON HCL 4 MG/2ML IJ SOLN: 4.0000 mg | Freq: Four times a day (QID) | INTRAMUSCULAR | Status: DC | PRN

## 2016-09-27 NOTE — Progress Notes (Signed)
Pharmacy Antibiotic Note  Bryan Proctor is a 60 y.o. male admitted on Jul 29, 2016 with sepsis.  Pharmacy has been consulted for Vancomycin/Aztreonam/Levaquin dosing.  Ankle wound also  Plan: -Patient to receive Vanc 1 gram IV in ER. Continue with Vancomycin 1250 IV every 18 hours.  Goal trough 15-20 mcg/mL. Ke=0.045  T1/2=15.4  Vd=63.49  Check trough prior to 5th dose on 12/28 at 0930.  -Levaquin 750mg  IV q24h  -Aztreonam 2 gm IV q8h      Weight: 200 lb (90.7 kg)  Temp (24hrs), Avg:85.5 F (29.7 C), Min:85 F (29.4 C), Max:85.9 F (29.9 C)   Recent Labs Lab 03/18/2016 1539  WBC 5.4  CREATININE 1.67*  LATICACIDVEN 3.2*    Estimated Creatinine Clearance: 54 mL/min (by C-G formula based on SCr of 1.67 mg/dL (H)).    Allergies  Allergen Reactions  . Penicillins Nausea And Vomiting    Antimicrobials this admission: Levaquin 12/25 >>   Aztreonam  12/25 >>   Vanc 12/25 >>  Dose adjustments this admission:    Microbiology results: 12/25 BCx: pending 12/25 UCx: pending    Sputum:      MRSA PCR:    Thank you for allowing pharmacy to be a part of this patient's care.  Jasline Buskirk A Jul 29, 2016 6:02 PM

## 2016-09-27 NOTE — ED Triage Notes (Signed)
Pt here from home via [facility], responsive to painful stimuli. EMS reports pt's friend last talked to pt yesterday around noon and went to check on him today and found him lying in his own vomiting. Pt airway intact upon arrival, even and nonlabored respirations noted. Pt covered in brown vomit, smells strongly of urine. Pt with large, draining wounds to left ankle.

## 2016-09-27 NOTE — ED Provider Notes (Signed)
[facility] Emergency Department Provider Note   ____________________________________________   First MD Initiated Contact with Patient 2016/01/14 1533     (approximate)  I have reviewed the triage vital signs and the nursing notes.   HISTORY  Chief Complaint Altered Mental Status  History limited by altered mental status.  HPI Bryan Proctor is a 60 y.o. male  who was found down by his neighbor. His neighbor reported he had last seen the patient yesterday. He arrived and brought the patient here the patient is awake and will not follow commands and is nonverbal at this time. He was surrounded vomit and blood apparently had been vomiting blood. Patient on review of old records has a history of drinking alcohol. No other records available at this point. Patient cannot say anything. On arrival in emergency room the patient felt cold was covered with dried blood. Patient's temperature was read as 85.3. Rectal probe initially read 83. Patient is Hemoccult positive stool. There is not a large mass of stool in his colon and rectum.   Past Medical History:  Diagnosis Date  . ADHD (attention deficit hyperactivity disorder)   . Alcoholism (HCC)   . Hypertension   . Insomnia     Patient Active Problem List   Diagnosis Date Noted  . Umbilical hernia 08/20/2016  . Alcoholism (HCC) 05/19/2015  . ADHD (attention deficit hyperactivity disorder) 05/19/2015  . Insomnia 05/19/2015  . Hypertension 05/19/2015    Past Surgical History:  Procedure Laterality Date  . KNEE SURGERY Left 2000, 2002    Prior to Admission medications   Medication Sig Start Date End Date Taking? Authorizing Provider  diazepam (VALIUM) 5 MG tablet Take 1 tablet (5 mg total) by mouth at bedtime as needed for anxiety. Patient not taking: Reported on 08/20/2016 11/17/15   Malva Limesonald E Fisher, MD  folic acid (FOLVITE) 1 MG tablet Take 1 mg by mouth daily. Reported on 11/17/2015    Historical Provider,  MD  LORazepam (ATIVAN) 1 MG tablet TAKE ONE TABLET BY MOUTH EVERY 6 HOURS AS NEEDED FOR INSOMNIA, NERVOUSNESS AND ELEVATED BLOOD PRESSURE Patient not taking: Reported on 08/20/2016 12/17/15   Malva Limesonald E Fisher, MD  metoprolol tartrate (LOPRESSOR) 25 MG tablet Take 1 tablet (25 mg total) by mouth 2 (two) times daily. Patient not taking: Reported on 08/20/2016 05/19/15   Malva Limesonald E Fisher, MD  spironolactone (ALDACTONE) 50 MG tablet Take 1 tablet (50 mg total) by mouth daily. 08/20/16   Malva Limesonald E Fisher, MD  thiamine 100 MG tablet Take 100 mg by mouth daily. Reported on 11/17/2015    Historical Provider, MD    Allergies Penicillins  Family History  Problem Relation Age of Onset  . Hypertension Mother     Social History Social History  Substance Use Topics  . Smoking status: Former Games developermoker  . Smokeless tobacco: Not on file  . Alcohol use 0.0 oz/week     Comment: Previous Heavy alcohol use, drank 6-12 beers daily. Quit drinking 01/04/2015.     Review of Systems  Unable to obtain ____________________________________________   PHYSICAL EXAM:  VITAL SIGNS: ED Triage Vitals  Enc Vitals Group     BP 2016/01/14 1557 (!) 117/96     Pulse Rate 2016/01/14 1557 70     Resp 2016/01/14 1557 15     Temp 2016/01/14 1557 (!) 85 F (29.4 C)     Temp Source 2016/01/14 1557 Rectal     SpO2 2016/01/14 1557 100 %  Weight 09/12/2016 1602 200 lb (90.7 kg)     Height --      Head Circumference --      Peak Flow --      Pain Score --      Pain Loc --      Pain Edu? --      Excl. in GC? --    Constitutional: Awake unresponsive Eyes: Conjunctivae are normal. PERRL. EOMI. Head: Atraumatic. Nose: No congestion/rhinnorhea. Dark blood and tearing to his nostrils Mouth/Throat: Mucous membranes are moist.  Oropharynx non-erythematous. Neck: No stridor. Cardiovascular: Normal rate, regular rhythm. Grossly normal heart sounds.  Good peripheral circulation. Respiratory: Normal respiratory effort.  No retractions.  Lungs CTAB. Gastrointestinal: Soft and nontender. Protuberant with a large umbilical hernia which is sticking out.. No abdominal bruits. No CVA tenderness. }Musculoskeletal: Bilateral edema apparently somewhat worse on the left has an ankle ulcer with socks stuck to it. Neurologic:  Patient moans and moves both of his legs slowly when the Foley is inserted. Skin: Skin is pale and cool foot ulcers noted on his leg .  ____________________________________________   LABS (all labs ordered are listed, but only abnormal results are displayed)  Labs Reviewed  ACETAMINOPHEN LEVEL - Abnormal; Notable for the following:       Result Value   Acetaminophen (Tylenol), Serum <10 (*)    All other components within normal limits  COMPREHENSIVE METABOLIC PANEL - Abnormal; Notable for the following:    Sodium 129 (*)    Chloride 98 (*)    CO2 20 (*)    Glucose, Bld 106 (*)    BUN 52 (*)    Creatinine, Ser 1.67 (*)    Albumin 2.0 (*)    AST 77 (*)    Total Bilirubin 2.5 (*)    GFR calc non Af Amer 43 (*)    GFR calc Af Amer 50 (*)    All other components within normal limits  LIPASE, BLOOD - Abnormal; Notable for the following:    Lipase 71 (*)    All other components within normal limits  LACTIC ACID, PLASMA - Abnormal; Notable for the following:    Lactic Acid, Venous 3.2 (*)    All other components within normal limits  CBC WITH DIFFERENTIAL/PLATELET - Abnormal; Notable for the following:    RBC 3.36 (*)    Hemoglobin 11.9 (*)    HCT 33.7 (*)    MCV 100.2 (*)    MCH 35.3 (*)    RDW 15.5 (*)    Platelets 94 (*)    Lymphs Abs 0.6 (*)    All other components within normal limits  URINALYSIS, COMPLETE (UACMP) WITH MICROSCOPIC - Abnormal; Notable for the following:    Color, Urine AMBER (*)    APPearance CLEAR (*)    Hgb urine dipstick SMALL (*)    All other components within normal limits  BLOOD GAS, ARTERIAL - Abnormal; Notable for the following:    pCO2 arterial 29 (*)     Bicarbonate 18.8 (*)    Acid-base deficit 4.9 (*)    All other components within normal limits  AMMONIA - Abnormal; Notable for the following:    Ammonia 127 (*)    All other components within normal limits  SEDIMENTATION RATE - Abnormal; Notable for the following:    Sed Rate 36 (*)    All other components within normal limits  PROTIME-INR - Abnormal; Notable for the following:    Prothrombin Time 19.5 (*)  All other components within normal limits  APTT - Abnormal; Notable for the following:    aPTT 46 (*)    All other components within normal limits  CULTURE, BLOOD (ROUTINE X 2)  CULTURE, BLOOD (ROUTINE X 2)  URINE CULTURE  ETHANOL  BRAIN NATRIURETIC PEPTIDE  SALICYLATE LEVEL  TROPONIN I  URINE DRUG SCREEN, QUALITATIVE ([facility] ONLY)  CK  TSH  LACTIC ACID, PLASMA  CBC WITH DIFFERENTIAL/PLATELET  HEMOGLOBIN AND HEMATOCRIT, BLOOD  CBG MONITORING, ED  TYPE AND SCREEN  TYPE AND SCREEN   ____________________________________________  EKG  EKG read and interpreted by me shows normal sinus rhythm right axis decreased amplitude computer is reading prolonged QT interval with QTC of 56 nonspecific ST-T wave changes ____________________________________________  RADIOLOGY  Study Result   CLINICAL DATA:  Altered mental status, found down  EXAM: CT HEAD WITHOUT CONTRAST  TECHNIQUE: Contiguous axial images were obtained from the base of the skull through the vertex without intravenous contrast.  COMPARISON:  10/15/2012  FINDINGS: Brain: No evidence of acute infarction, hemorrhage, hydrocephalus, extra-axial collection or mass lesion/mass effect.  Subcortical white matter and periventricular small vessel ischemic changes.  Vascular: Mild intracranial atherosclerosis.  Skull: Normal. Negative for fracture or focal lesion.  Sinuses/Orbits: Near complete opacification of the left maxillary sinus. Mild partial opacification of bilateral sphenoid and  right maxillary sinus. Mucosal thickening of the bilateral ethmoid sinuses.  Mastoid air cells are clear.  Other: Mild cortical atrophy.  IMPRESSION: No evidence of acute intracranial abnormality.  Atrophy with small vessel ischemic changes.  Paranasal sinus disease, as above.   Electronically Signed   By: Charline BillsSriyesh  Krishnan M.D.   On: 09/06/2016 16:59    ____________________________________________   PROCEDURES  Procedure(s) performed:   Procedures  Critical Care performed: Critical care time one hour including multiple re-evaluations of the patient  ____________________________________________   INITIAL IMPRESSION / ASSESSMENT AND PLAN / ED COURSE  Pertinent labs & imaging results that were available during my care of the patient were reviewed by me and considered in my medical decision making (see chart for details).   Clinical Course   We will treat this patient's sepsis. Additional labs and drug screen etc. were ordered. Patient is getting 2 L of warm fluids IV. He will get Levaquin IV. He was immediately placed on the bear hugger.  Patient's blood pressure has fallen somewhat he still altered. He ammonia is elevated his lipase is up slightly his liver functions are up slightly his temperatures come up by about a half a degree we are still doing a warming using the bear hugger I will give him a third liter of warm saline because of his drop in blood pressure. Typing screen is been delayed because of a clerical and/or ____________________________________________   FINAL CLINICAL IMPRESSION(S) / ED DIAGNOSES  Final diagnoses:  Altered mental status, unspecified altered mental status type  Hypothermia, initial encounter  Gastrointestinal hemorrhage with melena      NEW MEDICATIONS STARTED DURING THIS VISIT:  New Prescriptions   No medications on file     Note:  This document was prepared using Dragon voice recognition software and may include  unintentional dictation errors.    Arnaldo NatalPaul F Malinda, MD 09/21/2016 [phone]

## 2016-09-27 NOTE — ED Notes (Addendum)
Pt placed on Bair Hugger 

## 2016-09-27 NOTE — H&P (Addendum)
[place] [facility] [place] NAME: Bryan Proctor    MR#:  [id number]  DATE OF BIRTH:  [birth year]   DATE OF ADMISSION:  09/03/2016  PRIMARY CARE PHYSICIAN: Lelon Huh, MD   REQUESTING/REFERRING PHYSICIAN: Cinda Quest  CHIEF COMPLAINT:   Chief Complaint  Patient presents with  . Altered Mental Status    HISTORY OF PRESENT ILLNESS:  Bryan Proctor  is a 60 y.o. male with a known history of Alcoholism who is found by his friend on the floor. Patient unable to provide any information given mental status medical condition. He brought to the hospital he was noted that he was surrounded by vomitus which appeared to be blood tinged. He was markedly hypothermic on arrival to the emergency department temperature in the mid 80s  Emergency department course: Placed on Bair hugger, code sepsis initiated  PAST MEDICAL HISTORY:   Past Medical History:  Diagnosis Date  . ADHD (attention deficit hyperactivity disorder)   . Alcoholism ([place])   . Hypertension   . Insomnia     PAST SURGICAL HISTORY:   Past Surgical History:  Procedure Laterality Date  . KNEE SURGERY Left 2000, 2002    SOCIAL HISTORY:   Social History  Substance Use Topics  . Smoking status: Former Research scientist (life sciences)  . Smokeless tobacco: Never Used  . Alcohol use 0.0 oz/week     Comment: Previous Heavy alcohol use, drank 6-12 beers daily. Quit drinking 01/04/2015.     FAMILY HISTORY:   Family History  Problem Relation Age of Onset  . Hypertension Mother     DRUG ALLERGIES:   Allergies  Allergen Reactions  . Penicillins Nausea And Vomiting    REVIEW OF SYSTEMS:  Unable to obtain given patient's mental status medical condition   MEDICATIONS AT HOME:   Prior to Admission medications   Medication Sig Start Date End Date Taking? Authorizing Provider  diazepam (VALIUM) 5 MG tablet Take 1 tablet (5 mg total) by mouth at bedtime as needed for anxiety. Patient not taking: Reported on  08/20/2016 11/17/15   Birdie Sons, MD  folic acid (FOLVITE) 1 MG tablet Take 1 mg by mouth daily. Reported on 11/17/2015    Historical Provider, MD  LORazepam (ATIVAN) 1 MG tablet TAKE ONE TABLET BY MOUTH EVERY 6 HOURS AS NEEDED FOR INSOMNIA, NERVOUSNESS AND ELEVATED BLOOD PRESSURE Patient not taking: Reported on 08/20/2016 12/17/15   Birdie Sons, MD  metoprolol tartrate (LOPRESSOR) 25 MG tablet Take 1 tablet (25 mg total) by mouth 2 (two) times daily. Patient not taking: Reported on 08/20/2016 05/19/15   Birdie Sons, MD  spironolactone (ALDACTONE) 50 MG tablet Take 1 tablet (50 mg total) by mouth daily. 08/20/16   Birdie Sons, MD  thiamine 100 MG tablet Take 100 mg by mouth daily. Reported on 11/17/2015    Historical Provider, MD      VITAL SIGNS:  Blood pressure (!) 84/64, pulse 72, temperature (!) 85.9 F (29.9 C), resp. rate 14, weight 90.7 kg (200 lb), SpO2 99 %.  PHYSICAL EXAMINATION:   VITAL SIGNS: Vitals:   09/29/2016 1630 09/13/2016 1715  BP: 104/79 (!) 84/64  Pulse: 72 72  Resp: 16 14  Temp: (!) 85.6 F (29.8 C) (!) 85.9 F (29.9 C)   GENERAL:60 y.o.male critically ill HEAD: Normocephalic, atraumatic.  EYES: Pupils equal, round, reactive to light. Unable to assess extraocular muscles given mental status/medical condition. No scleral icterus.  MOUTH: Dry mucosal membrane. Dentition intact.  No abscess noted. Vomitus/dried blood around nares and mouth EAR, NOSE, THROAT: Clear without exudates. No external lesions.  NECK: Supple. No thyromegaly. No nodules. No JVD.  PULMONARY: Clear to ascultation, without wheeze rails or rhonci. No use of accessory muscles, Good respiratory effort. good air entry bilaterally CHEST: Nontender to palpation.  CARDIOVASCULAR: S1 and S2. Regular rate and rhythm. No murmurs, rubs, or gallops. 1+ edema. Pedal pulses 2+ bilaterally.  GASTROINTESTINAL: Soft, nontender, distended with umbilical hernia. No masses. Positive bowel sounds. No  hepatosplenomegaly.  MUSCULOSKELETAL: No swelling, clubbing, or edema. Range of motion full in all extremities.  NEUROLOGIC: Unable to assess given mental status/medical condition SKIN: Left lower extremity ulcer approximately 4 x 4 centimeters with surrounding erythema otherwise No ulceration, lesions, rashes, or cyanosis. Skin warm and dry. Turgor intact.  PSYCHIATRIC: Unable to assess given mental status/medical condition     LABORATORY PANEL:   CBC  Recent Labs Lab 10/01/2016 1539  WBC 5.4  HGB 11.9*  HCT 33.7*  PLT 94*   ------------------------------------------------------------------------------------------------------------------  Chemistries   Recent Labs Lab 09/09/2016 1539  NA 129*  K 4.7  CL 98*  CO2 20*  GLUCOSE 106*  BUN 52*  CREATININE 1.67*  CALCIUM 9.2  AST 77*  ALT 31  ALKPHOS 80  BILITOT 2.5*   ------------------------------------------------------------------------------------------------------------------  Cardiac Enzymes  Recent Labs Lab 09/17/2016 1539  TROPONINI <0.03   ------------------------------------------------------------------------------------------------------------------  RADIOLOGY:  Dg Ankle Complete Left  Result Date: 09/04/2016 CLINICAL DATA:  Ulceration along the medial aspect the left ankle. EXAM: LEFT ANKLE COMPLETE - 3+ VIEW COMPARISON:  None. FINDINGS: Ulceration along the medial malleolus and medial to the distal femoral metaphysis noted. Underlying periostitis along the distal tibia medially. Mild periostitis along the distal fibula. Vascular calcifications.  Dorsal spurring of the talar head. IMPRESSION: 1. Ulceration overlying the medial malleolus, with adjacent underlying periostitis/periosteal reaction. I cannot completely exclude the possibility of early osteomyelitis given the periostitis. No bony destructive findings. There is also mild periostitis in the distal fibula. Electronically Signed   By: Van Clines M.D.   On: 09/03/2016 17:19   Ct Head Wo Contrast  Result Date: 09/25/2016 CLINICAL DATA:  Altered mental status, found down EXAM: CT HEAD WITHOUT CONTRAST TECHNIQUE: Contiguous axial images were obtained from the base of the skull through the vertex without intravenous contrast. COMPARISON:  10/15/2012 FINDINGS: Brain: No evidence of acute infarction, hemorrhage, hydrocephalus, extra-axial collection or mass lesion/mass effect. Subcortical white matter and periventricular small vessel ischemic changes. Vascular: Mild intracranial atherosclerosis. Skull: Normal. Negative for fracture or focal lesion. Sinuses/Orbits: Near complete opacification of the left maxillary sinus. Mild partial opacification of bilateral sphenoid and right maxillary sinus. Mucosal thickening of the bilateral ethmoid sinuses. Mastoid air cells are clear. Other: Mild cortical atrophy. IMPRESSION: No evidence of acute intracranial abnormality. Atrophy with small vessel ischemic changes. Paranasal sinus disease, as above. Electronically Signed   By: Julian Hy M.D.   On: 09/07/2016 16:59   Dg Chest Portable 1 View  Result Date: 09/26/2016 CLINICAL DATA:  Unresponsive patient, vomiting, ulcer along the ankle, altered mental status EXAM: PORTABLE CHEST 1 VIEW COMPARISON:  Report from 01/10/2000 FINDINGS: Dilated loops of small bowel in the upper abdomen measuring up to 4.8 cm. Mild airspace opacity along the left hemidiaphragm, I favor atelectasis over pneumonia. Low lung volumes.  Bony demineralization. IMPRESSION: 1. 1. Dilated small bowel loops up to 4.8 cm, otherwise nonspecific. Small bowel obstruction not excluded. Abdominal imaging suggested. 2. Indistinct airspace opacity at  the left lung base, I favor atelectasis over pneumonia although the appearance is not entirely specific. Electronically Signed   By: Van Clines M.D.   On: 09/30/2016 17:17    EKG:   Orders placed or performed during the hospital  encounter of 09/05/2016  . ED EKG 12-Lead  . ED EKG 12-Lead  . EKG 12-Lead  . EKG 12-Lead  . EKG 12-Lead  . EKG 12-Lead    IMPRESSION AND PLAN:   60 year old Caucasian gentleman history of alcohol abuse and hypertension who is presenting what being found unresponsive and hypothermic  1. Shock- multifactorial with bleeding, possibly infection: Patient technically does not meet septic criteria but will treat as such until further information is available-suspected source left lower extremity ulcer/cellulitis ESR elevated if patient becomes stabilized I would then get MRI left leg to rule out osteomyelitis. Continue with bare hugger, IV fluids  2. Lactic acidosis/hypotension: IV fluid hydration patient may require pressor therapy to maintain mean arterial pressure greater than 65  3. GI bleed: Patient covered in vomitus and dried blood, hemoglobin is stable at this time and place on IV Protonix trend CBCs every 6 hours, check INR  4. Acute kidney injury: IV fluid hydration fluid further nephrotoxic agents  5. History of essential hypertension: Hold all medications given hypotensive state  6. Hyponatremia: IV fluid hydration  7. Hepatic encephalopathy: Lactulose/Xifaxan   Patient has poor prognosis currently in shock with multiorgan failure Patient has numerous problems will prioritize GI bleeding and possible infection treated with Protonix IV fluids and antibiotics-listed penicillin allergy we'll treat for sepsis of unknown etiology with Levaquin aztreonam and vancomycin     All the records are reviewed and case discussed with ED provider. Management plans discussed with the patient, family and they are in agreement.  CODE STATUS: full  TOTAL TIME TAKING CARE OF THIS PATIENT: 45 critical  minutes.    Hower,  Karenann Cai.D on 09/26/2016 at 5:55 PM  Between 7am to 6pm - Pager - [phone]  After 6pm: House Pager: - [phone]  [facility]    Office  [phone]  CC: Primary care physician; Lelon Huh, MD

## 2016-09-27 NOTE — ED Notes (Signed)
Patient responsive to this Rn applying tourniquet, followed my command to relax arm.

## 2016-09-28 ENCOUNTER — Inpatient Hospital Stay: Admit: 2016-09-28 | Payer: 59

## 2016-09-28 ENCOUNTER — Inpatient Hospital Stay: Payer: 59

## 2016-09-28 DIAGNOSIS — N179 Acute kidney failure, unspecified: Secondary | ICD-10-CM

## 2016-09-28 DIAGNOSIS — R6521 Severe sepsis with septic shock: Secondary | ICD-10-CM

## 2016-09-28 DIAGNOSIS — K7210 Chronic hepatic failure without coma: Secondary | ICD-10-CM

## 2016-09-28 DIAGNOSIS — A419 Sepsis, unspecified organism: Principal | ICD-10-CM

## 2016-09-28 DIAGNOSIS — R6520 Severe sepsis without septic shock: Secondary | ICD-10-CM

## 2016-09-28 DIAGNOSIS — J9600 Acute respiratory failure, unspecified whether with hypoxia or hypercapnia: Secondary | ICD-10-CM

## 2016-09-28 DIAGNOSIS — K7290 Hepatic failure, unspecified without coma: Secondary | ICD-10-CM

## 2016-09-28 LAB — BLOOD CULTURE ID PANEL (REFLEXED)
ACINETOBACTER BAUMANNII: NOT DETECTED
CANDIDA ALBICANS: NOT DETECTED
CANDIDA PARAPSILOSIS: NOT DETECTED
CANDIDA TROPICALIS: NOT DETECTED
Candida glabrata: NOT DETECTED
Candida krusei: NOT DETECTED
Carbapenem resistance: NOT DETECTED
ENTEROBACTER CLOACAE COMPLEX: NOT DETECTED
Enterobacteriaceae species: DETECTED — AB
Enterococcus species: NOT DETECTED
Escherichia coli: DETECTED — AB
HAEMOPHILUS INFLUENZAE: NOT DETECTED
KLEBSIELLA PNEUMONIAE: NOT DETECTED
Klebsiella oxytoca: NOT DETECTED
Listeria monocytogenes: NOT DETECTED
Neisseria meningitidis: NOT DETECTED
PROTEUS SPECIES: NOT DETECTED
Pseudomonas aeruginosa: NOT DETECTED
SERRATIA MARCESCENS: NOT DETECTED
STREPTOCOCCUS SPECIES: NOT DETECTED
Staphylococcus aureus (BCID): NOT DETECTED
Staphylococcus species: NOT DETECTED
Streptococcus agalactiae: NOT DETECTED
Streptococcus pneumoniae: NOT DETECTED
Streptococcus pyogenes: NOT DETECTED

## 2016-09-28 LAB — COMPREHENSIVE METABOLIC PANEL
ALBUMIN: 1.8 g/dL — AB (ref 3.5–5.0)
ALK PHOS: 75 U/L (ref 38–126)
ALT: 30 U/L (ref 17–63)
ANION GAP: 10 (ref 5–15)
AST: 82 U/L — ABNORMAL HIGH (ref 15–41)
BILIRUBIN TOTAL: 2.8 mg/dL — AB (ref 0.3–1.2)
BUN: 50 mg/dL — ABNORMAL HIGH (ref 6–20)
CO2: 17 mmol/L — AB (ref 22–32)
CREATININE: 1.91 mg/dL — AB (ref 0.61–1.24)
Calcium: 8.4 mg/dL — ABNORMAL LOW (ref 8.9–10.3)
Chloride: 104 mmol/L (ref 101–111)
GFR calc Af Amer: 43 mL/min — ABNORMAL LOW (ref >60)
GFR calc non Af Amer: 37 mL/min — ABNORMAL LOW (ref >60)
GLUCOSE: 76 mg/dL (ref 65–99)
Potassium: 4.7 mmol/L (ref 3.5–5.1)
SODIUM: 131 mmol/L — AB (ref 135–145)
TOTAL PROTEIN: 6 g/dL — AB (ref 6.5–8.1)

## 2016-09-28 LAB — CBC
HCT: 31.9 % — ABNORMAL LOW (ref 40.0–52.0)
HCT: 30.8 % — ABNORMAL LOW (ref 40.0–52.0)
HCT: 28.8 % — ABNORMAL LOW (ref 40.0–52.0)
HEMATOCRIT: 26.4 % — AB (ref 40.0–52.0)
HEMOGLOBIN: 11.2 g/dL — AB (ref 13.0–18.0)
HEMOGLOBIN: 9.1 g/dL — AB (ref 13.0–18.0)
Hemoglobin: 10.7 g/dL — ABNORMAL LOW (ref 13.0–18.0)
Hemoglobin: 10.1 g/dL — ABNORMAL LOW (ref 13.0–18.0)
MCH: 35.7 pg — AB (ref 26.0–34.0)
MCH: 34.8 pg — ABNORMAL HIGH (ref 26.0–34.0)
MCH: 35.1 pg — ABNORMAL HIGH (ref 26.0–34.0)
MCH: 34.2 pg — AB (ref 26.0–34.0)
MCHC: 35.3 g/dL (ref 32.0–36.0)
MCHC: 34.7 g/dL (ref 32.0–36.0)
MCHC: 35 g/dL (ref 32.0–36.0)
MCHC: 34.4 g/dL (ref 32.0–36.0)
MCV: 101.2 fL — ABNORMAL HIGH (ref 80.0–100.0)
MCV: 100.1 fL — ABNORMAL HIGH (ref 80.0–100.0)
MCV: 100.2 fL — ABNORMAL HIGH (ref 80.0–100.0)
MCV: 99.5 fL (ref 80.0–100.0)
PLATELETS: 109 10*3/uL — AB (ref 150–440)
PLATELETS: 109 10*3/uL — AB (ref 150–440)
PLATELETS: 77 10*3/uL — AB (ref 150–440)
Platelets: 97 10*3/uL — ABNORMAL LOW (ref 150–440)
RBC: 3.15 MIL/uL — ABNORMAL LOW (ref 4.40–5.90)
RBC: 3.07 MIL/uL — AB (ref 4.40–5.90)
RBC: 2.88 MIL/uL — AB (ref 4.40–5.90)
RBC: 2.66 MIL/uL — AB (ref 4.40–5.90)
RDW: 16.4 % — AB (ref 11.5–14.5)
RDW: 15.6 % — ABNORMAL HIGH (ref 11.5–14.5)
RDW: 16.3 % — ABNORMAL HIGH (ref 11.5–14.5)
RDW: 16.1 % — ABNORMAL HIGH (ref 11.5–14.5)
WBC: 5.4 10*3/uL (ref 3.8–10.6)
WBC: 5.9 10*3/uL (ref 3.8–10.6)
WBC: 13.3 10*3/uL — AB (ref 3.8–10.6)
WBC: 14 10*3/uL — AB (ref 3.8–10.6)

## 2016-09-28 LAB — BLOOD GAS, ARTERIAL
Acid-base deficit: 7.8 mmol/L — ABNORMAL HIGH (ref 0.0–2.0)
Bicarbonate: 17.5 mmol/L — ABNORMAL LOW (ref 20.0–28.0)
FIO2: 0.5
MECHVT: 500 mL
O2 Saturation: 94.6 %
PATIENT TEMPERATURE: 37
PCO2 ART: 34 mmHg (ref 32.0–48.0)
PEEP: 5 cmH2O
PO2 ART: 80 mmHg — AB (ref 83.0–108.0)
RATE: 15 resp/min
pH, Arterial: 7.32 — ABNORMAL LOW (ref 7.350–7.450)

## 2016-09-28 LAB — BODY FLUID CELL COUNT WITH DIFFERENTIAL
EOS FL: 0 %
Lymphs, Fluid: 31 %
MONOCYTE-MACROPHAGE-SEROUS FLUID: 47 %
NEUTROPHIL FLUID: 22 %
OTHER CELLS FL: 0 %
WBC FLUID: 37 uL

## 2016-09-28 LAB — ALBUMIN, FLUID (OTHER): Albumin, Fluid: <1 g/dL

## 2016-09-28 LAB — URINE CULTURE: CULTURE: NO GROWTH

## 2016-09-28 LAB — AMMONIA: AMMONIA: 173 umol/L — AB (ref 9–35)

## 2016-09-28 LAB — LACTIC ACID, PLASMA: LACTIC ACID, VENOUS: 3.7 mmol/L — AB (ref 0.5–1.9)

## 2016-09-28 LAB — GLUCOSE, CAPILLARY: Glucose-Capillary: 152 mg/dL — ABNORMAL HIGH (ref 65–99)

## 2016-09-28 MED ORDER — FENTANYL 2500MCG IN NS 250ML (10MCG/ML) PREMIX INFUSION
10.0000 ug/h | INTRAVENOUS | Status: DC
  Filled 2016-09-28 (×2): qty 250

## 2016-09-28 MED ORDER — FENTANYL BOLUS VIA INFUSION
50.0000 ug | INTRAVENOUS | Status: DC | PRN
  Filled 2016-09-28: qty 50

## 2016-09-28 MED ORDER — FENTANYL 2500MCG IN NS 250ML (10MCG/ML) PREMIX INFUSION
0.0000 ug/h | INTRAVENOUS | Status: DC
  Administered 2016-09-28: 50 ug/h via INTRAVENOUS

## 2016-09-28 MED ORDER — NOREPINEPHRINE BITARTRATE 1 MG/ML IV SOLN
0.0000 ug/min | INTRAVENOUS | Status: DC
  Administered 2016-09-28: 14 ug/min via INTRAVENOUS
  Administered 2016-09-28: 8 ug/min via INTRAVENOUS
  Filled 2016-09-28 (×2): qty 4

## 2016-09-28 MED ORDER — ALBUMIN HUMAN 25 % IV SOLN
25.0000 g | Freq: Once | INTRAVENOUS | Status: AC
  Administered 2016-09-28: 25 g via INTRAVENOUS
  Filled 2016-09-28: qty 500
  Filled 2016-09-28: qty 100

## 2016-09-28 MED ORDER — SODIUM CHLORIDE 0.9 % IV SOLN
8.0000 mg/h | INTRAVENOUS | Status: DC
  Administered 2016-09-28 – 2016-09-29 (×3): 8 mg/h via INTRAVENOUS
  Filled 2016-09-28 (×3): qty 80

## 2016-09-28 MED ORDER — FENTANYL CITRATE (PF) 100 MCG/2ML IJ SOLN
50.0000 ug | Freq: Once | INTRAMUSCULAR | Status: AC
Start: 2016-09-28 — End: 2016-09-28
  Administered 2016-09-28: 50 ug via INTRAVENOUS

## 2016-09-28 MED ORDER — LEVOFLOXACIN IN D5W 750 MG/150ML IV SOLN: 750.0000 mg | INTRAVENOUS | Status: DC

## 2016-09-28 MED ORDER — SODIUM CHLORIDE 0.9 % IV SOLN
80.0000 mg | Freq: Once | INTRAVENOUS | Status: AC
  Administered 2016-09-28: 06:00:00 80 mg via INTRAVENOUS
  Filled 2016-09-28: qty 80

## 2016-09-28 MED ORDER — IOPAMIDOL (ISOVUE-300) INJECTION 61%
15.0000 mL | INTRAVENOUS | Status: AC
Start: 2016-09-28 — End: 2016-09-28
  Administered 2016-09-28 (×2): 15 mL via ORAL

## 2016-09-28 MED ORDER — MIDAZOLAM HCL 2 MG/2ML IJ SOLN
INTRAMUSCULAR | Status: AC
  Filled 2016-09-28: qty 4

## 2016-09-28 MED ORDER — ROCURONIUM BROMIDE 50 MG/5ML IV SOLN
1.0000 mg/kg | Freq: Once | INTRAVENOUS | Status: AC
  Administered 2016-09-28: 50 mg via INTRAVENOUS

## 2016-09-28 MED ORDER — THIAMINE HCL 100 MG/ML IJ SOLN
100.0000 mg | Freq: Every day | INTRAMUSCULAR | Status: DC
  Administered 2016-09-28 – 2016-09-30 (×3): 100 mg via INTRAVENOUS
  Filled 2016-09-28 (×3): qty 2

## 2016-09-28 MED ORDER — ORAL CARE MOUTH RINSE
15.0000 mL | Freq: Four times a day (QID) | OROMUCOSAL | Status: DC
  Administered 2016-09-29 – 2016-10-01 (×10): 15 mL via OROMUCOSAL

## 2016-09-28 MED ORDER — SODIUM CHLORIDE 0.9 % IV SOLN
INTRAVENOUS | Status: DC | PRN
  Administered 2016-09-28: 06:00:00 via INTRAVENOUS

## 2016-09-28 MED ORDER — SODIUM CHLORIDE 0.9 % IV SOLN
0.0300 [IU]/min | INTRAVENOUS | Status: DC
  Administered 2016-09-28: 0.03 [IU]/min via INTRAVENOUS
  Filled 2016-09-28 (×2): qty 2

## 2016-09-28 MED ORDER — IPRATROPIUM-ALBUTEROL 0.5-2.5 (3) MG/3ML IN SOLN: 3.0000 mL | Freq: Four times a day (QID) | RESPIRATORY_TRACT | Status: DC | PRN

## 2016-09-28 MED ORDER — MIDAZOLAM HCL 2 MG/2ML IJ SOLN
2.0000 mg | Freq: Once | INTRAMUSCULAR | Status: AC
  Administered 2016-09-28: 2 mg via INTRAVENOUS

## 2016-09-28 MED ORDER — FENTANYL CITRATE (PF) 100 MCG/2ML IJ SOLN
50.0000 ug | Freq: Once | INTRAMUSCULAR | Status: AC
  Administered 2016-09-28: 50 ug via INTRAVENOUS

## 2016-09-28 MED ORDER — PANTOPRAZOLE SODIUM 40 MG IV SOLR: 40.0000 mg | Freq: Two times a day (BID) | INTRAVENOUS | Status: DC

## 2016-09-28 MED ORDER — NOREPINEPHRINE BITARTRATE 1 MG/ML IV SOLN
0.0000 ug/min | INTRAVENOUS | Status: DC
  Administered 2016-09-28: 20 ug/min via INTRAVENOUS
  Administered 2016-09-29: 15 ug/min via INTRAVENOUS
  Filled 2016-09-28 (×2): qty 16

## 2016-09-28 MED ORDER — FENTANYL CITRATE (PF) 100 MCG/2ML IJ SOLN
INTRAMUSCULAR | Status: AC
  Filled 2016-09-28: qty 4

## 2016-09-28 MED ORDER — CHLORHEXIDINE GLUCONATE 0.12% ORAL RINSE (MEDLINE KIT)
15.0000 mL | Freq: Two times a day (BID) | OROMUCOSAL | Status: DC
  Administered 2016-09-28 – 2016-09-30 (×5): 15 mL via OROMUCOSAL

## 2016-09-28 MED ORDER — SODIUM CHLORIDE 0.9 % IV SOLN
1.0000 mg | Freq: Once | INTRAVENOUS | Status: AC
  Administered 2016-09-28: 1 mg via INTRAVENOUS
  Filled 2016-09-28: qty 0.2

## 2016-09-28 MED ORDER — ROCURONIUM BROMIDE 50 MG/5ML IV SOLN
INTRAVENOUS | Status: AC
  Filled 2016-09-28: qty 1

## 2016-09-28 MED ORDER — MEROPENEM 1 G IV SOLR
2.0000 g | Freq: Two times a day (BID) | INTRAVENOUS | Status: DC
  Administered 2016-09-28 (×2): 2 g via INTRAVENOUS
  Filled 2016-09-28 (×3): qty 2

## 2016-09-28 NOTE — Procedures (Signed)
PROCEDURE NOTE: L FEMORAL HD CATH PLACEMENT  INDICATION:    Monitoring of central venous pressures and/or administration of medications optimally administered in central vein  CONSENT:   No fmily and pt comatose. Performed on emergent basis  PROCEDURE  Sterile technique was used including antiseptics, cap, gloves, gown, hand hygiene, mask and full body sheet.  Skin prep: Chlorhexidine; local anesthetic administered  A triple lumen catheter was placed in the L femoral vein using the Seldinger technique.   EVALUATION:  Blood flow good  Complications: No apparent complications  Patient tolerated the procedure well.    [name] Brande Uncapher, MD PCCM service Mobile [phone]

## 2016-09-28 NOTE — Progress Notes (Signed)
Pt on the vent. Will assume care once pt off vent

## 2016-09-28 NOTE — Progress Notes (Addendum)
During NP assessment of patient need for intubation was decided. Patient intubated, cvc line placed, and NG tube placed. NG to low int sx per order from HolleyDana, NP. Levo started for persistent hypotension. Patient receiving fentanyl gtt for sedation, tolerating well. Patient had large amount of coffee ground emesis during and after intubation, protonix gtt started per order. Currently patient stable, will continue to monitor. Lactic acid elevated, Annabelle Harmanana, NP notified. Trudee KusterBrandi R [place]

## 2016-09-28 NOTE — Consult Note (Signed)
[facility]  CONSULT NOTE    Date: 09/28/2016                  Patient Name:  Bryan Proctor  MRN: [id number]  DOB: 05-28-1956  Age / Sex: 60 y.o., male         PCP: Mila Merry, MD                 Service Requesting Consult: Dr. Glenna Fellows                 Reason for Consult: Acute Renal Failure            History of Present Illness: Bryan Proctor is a 60 y.o. white male with ETOH abuse, hypertension, end stage liver disease, who was admitted to [facility] on 10-13-16 for Hypothermia, initial encounter [T68.XXXA] Gastrointestinal hemorrhage with melena [K92.1] Altered mental status, unspecified altered mental status type [R41.82]   Patient was found down for unknown time found by neighbor. Patient was hypothermic with temp of 85 degrees.   Currently on warming blanket bare hugger, norepinephrine gtt.   No family at bedside.    Medications: Outpatient medications: Prescriptions Prior to Admission  Medication Sig Dispense Refill Last Dose  . diazepam (VALIUM) 5 MG tablet Take 1 tablet (5 mg total) by mouth at bedtime as needed for anxiety. (Patient not taking: Reported on 08/20/2016) 30 tablet 0 Not Taking  . folic acid (FOLVITE) 1 MG tablet Take 1 mg by mouth daily. Reported on 11/17/2015   Not Taking  . LORazepam (ATIVAN) 1 MG tablet TAKE ONE TABLET BY MOUTH EVERY 6 HOURS AS NEEDED FOR INSOMNIA, NERVOUSNESS AND ELEVATED BLOOD PRESSURE (Patient not taking: Reported on 08/20/2016) 30 tablet 2 Not Taking  . metoprolol tartrate (LOPRESSOR) 25 MG tablet Take 1 tablet (25 mg total) by mouth 2 (two) times daily. (Patient not taking: Reported on 08/20/2016) 60 tablet 5 Not Taking  . spironolactone (ALDACTONE) 50 MG tablet Take 1 tablet (50 mg total) by mouth daily. 30 tablet 0   . thiamine 100 MG tablet Take 100 mg by mouth daily. Reported on 11/17/2015   Not Taking    Current medications: Current Facility-Administered Medications  Medication Dose Route Frequency  Provider Last Rate Last Dose  . 0.9 %  sodium chloride infusion   Intravenous PRN Eugenie Norrie, NP 5 mL/hr at 09/28/16 4098    . acetaminophen (TYLENOL) tablet 650 mg  650 mg Oral Q6H PRN Wyatt Haste, MD       Or  . acetaminophen (TYLENOL) suppository 650 mg  650 mg Rectal Q6H PRN Wyatt Haste, MD      . fentaNYL (SUBLIMAZE) bolus via infusion 50 mcg  50 mcg Intravenous Q1H PRN Eugenie Norrie, NP      . fentaNYL in NS (61mcg/ml) infusion-PREMIX  0-100 mcg/hr Intravenous Continuous Merwyn Katos, MD 6 mL/hr at 09/28/16 0854 60 mcg/hr at 09/28/16 0854  . folic acid 1 mg in sodium chloride 0.9 % 50 mL IVPB  1 mg Intravenous Once Merwyn Katos, MD      . ipratropium-albuterol (DUONEB) 0.5-2.5 (3) MG/3ML nebulizer solution 3 mL  3 mL Nebulization Q6H PRN Alexis Hugelmeyer, DO      . lactulose (CHRONULAC) 10 GM/15ML solution 30 g  30 g Oral BID Wyatt Haste, MD   30 g at 09/28/16 1191  . meropenem (MERREM) 2 g in sodium chloride 0.9 % 100 mL  IVPB  2 g Intravenous Q12H Ihor Austin, MD   2 g at 09/28/16 1021  . norepinephrine (LEVOPHED) 4 mg in dextrose 5 % 250 mL (0.016 mg/mL) infusion  0-40 mcg/min Intravenous Titrated Erin Fulling, MD 52.5 mL/hr at 09/28/16 1021 14 mcg/min at 09/28/16 1021  . ondansetron (ZOFRAN) injection 4 mg  4 mg Intravenous Q6H PRN Wyatt Haste, MD      . pantoprazole (PROTONIX) 80 mg in sodium chloride 0.9 % 250 mL (0.32 mg/mL) infusion  8 mg/hr Intravenous Continuous Eugenie Norrie, NP 25 mL/hr at 09/28/16 0600 8 mg/hr at 09/28/16 0600  . [START ON 10/01/2016] pantoprazole (PROTONIX) injection 40 mg  40 mg Intravenous Q12H Eugenie Norrie, NP      . rifaximin Burman Blacksmith) tablet 550 mg  550 mg Oral BID Wyatt Haste, MD   550 mg at 09/28/16 0837  . sodium chloride flush (NS) 0.9 % injection 3 mL  3 mL Intravenous Q12H Wyatt Haste, MD   3 mL at 09/28/16 0837  . thiamine (B-1) injection 100 mg  100 mg Intravenous Daily Eugenie Norrie, NP   100 mg at  09/28/16 4098  . vancomycin (VANCOCIN) 1,250 mg in sodium chloride 0.9 % 250 mL IVPB  1,250 mg Intravenous Q18H Wyatt Haste, MD   1,250 mg at 09/28/16 0044      Allergies: Allergies  Allergen Reactions  . Penicillins Nausea And Vomiting      Past Medical History: Past Medical History:  Diagnosis Date  . ADHD (attention deficit hyperactivity disorder)   . Alcoholism (HCC)   . Hypertension   . Insomnia      Past Surgical History: Past Surgical History:  Procedure Laterality Date  . KNEE SURGERY Left 2000, 2002     Family History: Family History  Problem Relation Age of Onset  . Hypertension Mother      Social History: Social History   Social History  . Marital status: Single    Spouse name: N/A  . Number of children: N/A  . Years of education: N/A   Occupational History  . Unemployed    Social History Main Topics  . Smoking status: Former Games developer  . Smokeless tobacco: Never Used  . Alcohol use 0.0 oz/week     Comment: Previous Heavy alcohol use, drank 6-12 beers daily. Quit drinking 01/04/2015.   . Drug use:      Comment: uses Marijuana  . Sexual activity: Not on file   Other Topics Concern  . Not on file   Social History Narrative  . No narrative on file     Review of Systems: Review of Systems  Unable to perform ROS: Critical illness    Vital Signs: Blood pressure (!) 85/63, pulse 97, temperature (!) 96.3 F (35.7 C), resp. rate (!) 27, weight 84.3 kg (185 lb 13.6 oz), SpO2 96 %.  Weight trends: Filed Weights   October 02, 2016 1602 09/25/2016 1943  Weight: 90.7 kg (200 lb) 84.3 kg (185 lb 13.6 oz)    Physical Exam: General: Critically Ill  Head: ETT  Eyes: Eyes closed  Neck:   trachea midline  Lungs:  Clear to auscultation, PRVC FiO2 50%  Heart: Regular rate and rhythm  Abdomen:  distended  Extremities: 1+ peripheral edema.  Neurologic: Intubated, sedated  Skin: No lesions  Access: none     Lab results: Basic Metabolic  Panel:  Recent Labs Lab Oct 02, 2016 1539 09/28/16 0538  NA 129* 131*  K 4.7 4.7  CL 98* 104  CO2 20* 17*  GLUCOSE 106* 76  BUN 52* 50*  CREATININE 1.67* 1.91*  CALCIUM 9.2 8.4*    Liver Function Tests:  Recent Labs Lab 09/28/2016 1539 09/28/16 0538  AST 77* 82*  ALT 31 30  ALKPHOS 80 75  BILITOT 2.5* 2.8*  PROT 6.5 6.0*  ALBUMIN 2.0* 1.8*    Recent Labs Lab 09/15/2016 1539  LIPASE 71*    Recent Labs Lab 10/01/2016 1539 09/28/16 0844  AMMONIA 127* 173*    CBC:  Recent Labs Lab 09/17/2016 1539  09/05/2016 2151 09/28/16 0538 09/28/16 0844  WBC 5.4  --  3.9 5.4 5.9  NEUTROABS 4.4  --   --   --   --   HGB 11.9*  < > 9.0* 11.2* 10.7*  HCT 33.7*  < > 26.1* 31.9* 30.8*  MCV 100.2*  --  99.4 101.2* 100.1*  PLT 94*  --  78* 109* 109*  < > = values in this interval not displayed.  Cardiac Enzymes:  Recent Labs Lab 09/10/2016 1539  CKTOTAL 239  TROPONINI <0.03    BNP: Invalid input(s): POCBNP  CBG:  Recent Labs Lab 10/01/2016 1919  GLUCAP 152*    Microbiology: Results for orders placed or performed during the hospital encounter of 09/05/2016  Blood Culture (routine x 2)     Status: None (Preliminary result)   Collection Time: 09/10/2016  3:39 PM  Result Value Ref Range Status   Specimen Description BLOOD R AC  Final   Special Requests   Final    BOTTLES DRAWN AEROBIC AND ANAEROBIC  AER 10 ANA   Culture  Setup Time   Final    Organism ID to follow GRAM NEGATIVE RODS ANAEROBIC BOTTLE ONLY CRITICAL RESULT CALLED TO, READ BACK BY AND VERIFIED WITH: MATT MCBANE ON 09/28/16 AT 1610 QSD    Culture GRAM NEGATIVE RODS  Final   Report Status PENDING  Incomplete  Blood Culture (routine x 2)     Status: None (Preliminary result)   Collection Time: 09/13/2016  3:39 PM  Result Value Ref Range Status   Specimen Description BLOOD L AC  Final   Special Requests BOTTLES DRAWN AEROBIC AND ANAEROBIC ANA 9 AER  Final   Culture NO GROWTH < 24 HOURS  Final    Report Status PENDING  Incomplete  Blood Culture ID Panel (Reflexed)     Status: Abnormal   Collection Time: 09/22/2016  3:39 PM  Result Value Ref Range Status   Enterococcus species NOT DETECTED NOT DETECTED Final   Listeria monocytogenes NOT DETECTED NOT DETECTED Final   Staphylococcus species NOT DETECTED NOT DETECTED Final   Staphylococcus aureus NOT DETECTED NOT DETECTED Final   Streptococcus species NOT DETECTED NOT DETECTED Final   Streptococcus agalactiae NOT DETECTED NOT DETECTED Final   Streptococcus pneumoniae NOT DETECTED NOT DETECTED Final   Streptococcus pyogenes NOT DETECTED NOT DETECTED Final   Acinetobacter baumannii NOT DETECTED NOT DETECTED Final   Enterobacteriaceae species DETECTED (A) NOT DETECTED Final    Comment: CRITICAL RESULT CALLED TO, READ BACK BY AND VERIFIED WITH: MATT MCBANE ON 09/28/16 AT 0647 QSD    Enterobacter cloacae complex NOT DETECTED NOT DETECTED Final   Escherichia coli DETECTED (A) NOT DETECTED Final    Comment: CRITICAL RESULT CALLED TO, READ BACK BY AND VERIFIED WITH: MATT MCBANE ON 09/28/16 AT 9604 QSD    Klebsiella oxytoca NOT DETECTED NOT DETECTED Final   Klebsiella pneumoniae NOT DETECTED NOT DETECTED  Final   Proteus species NOT DETECTED NOT DETECTED Final   Serratia marcescens NOT DETECTED NOT DETECTED Final   Carbapenem resistance NOT DETECTED NOT DETECTED Final   Haemophilus influenzae NOT DETECTED NOT DETECTED Final   Neisseria meningitidis NOT DETECTED NOT DETECTED Final   Pseudomonas aeruginosa NOT DETECTED NOT DETECTED Final   Candida albicans NOT DETECTED NOT DETECTED Final   Candida glabrata NOT DETECTED NOT DETECTED Final   Candida krusei NOT DETECTED NOT DETECTED Final   Candida parapsilosis NOT DETECTED NOT DETECTED Final   Candida tropicalis NOT DETECTED NOT DETECTED Final  MRSA PCR Screening     Status: None   Collection Time: 02-24-16  7:30 PM  Result Value Ref Range Status   MRSA by PCR NEGATIVE NEGATIVE Final     Comment:        The GeneXpert MRSA Assay (FDA approved for NASAL specimens only), is one component of a comprehensive MRSA colonization surveillance program. It is not intended to diagnose MRSA infection nor to guide or monitor treatment for MRSA infections.     Coagulation Studies:  Recent Labs  02-24-16 1539 02-24-16 1844  LABPROT 19.5* 23.7*  INR 1.63 2.08    Urinalysis:  Recent Labs  02-24-16 1539  COLORURINE AMBER*  LABSPEC 1.016  PHURINE 5.0  GLUCOSEU NEGATIVE  HGBUR SMALL*  BILIRUBINUR NEGATIVE  KETONESUR NEGATIVE  PROTEINUR NEGATIVE  NITRITE NEGATIVE  LEUKOCYTESUR NEGATIVE      Imaging: Ct Abdomen Pelvis Wo Contrast  Result Date: 09/28/2016 CLINICAL DATA:  60 year old male with coffee-ground emesis and abdominal distention. Intubated. EXAM: CT ABDOMEN AND PELVIS WITHOUT CONTRAST TECHNIQUE: Multidetector CT imaging of the abdomen and pelvis was performed following the standard protocol without IV contrast. COMPARISON:  09/28/2016 abdominal radiographs. FINDINGS: Lower chest: Small bilateral dependent pleural effusions. The tip of a superior approach central venous catheter is seen at the cavoatrial junction. Coronary atherosclerosis. There is patchy consolidation with some volume loss in the dependent lower lobes bilaterally. Oral contrast is seen in the visualized lower thoracic esophagus. There is mildly irregular circumferential wall thickening in the visualized lower thoracic esophagus. Hepatobiliary: Atrophic liver with diffuse surface irregularity, indicating cirrhosis. Hypodense 0.7 cm liver lesion in the right liver lobe (series 2/ image 23). No additional liver lesion. Layering sludge versus stones in the nondistended gallbladder with no definite gallbladder wall thickening. No biliary ductal dilatation. Pancreas: Normal, with no mass or duct dilation. Spleen: Normal size. No mass. Adrenals/Urinary Tract: Mild irregular thickening of both adrenal glands  without discrete adrenal nodules. No hydronephrosis. No renal stones. Exophytic simple 1.0 cm renal cyst in the lateral interpolar right kidney. No additional contour deforming renal lesions. Bladder is collapsed by indwelling Foley catheter and is grossly normal. Stomach/Bowel: Oral contrast minimally distends the stomach, which appears grossly normal. Enteric tube terminates in the proximal stomach. Oral contrast progresses to the descending colon. There a few mildly dilated small bowel loops with air-fluid levels in the mid abdomen measuring up to the 3.7 cm diameter. No small bowel wall thickening. No focal small bowel caliber transition. Normal appendix . Mild sigmoid diverticulosis, with no large bowel wall thickening or significant pericolonic fat stranding. Rectal catheter is in place. Vascular/Lymphatic: Atherosclerotic nonaneurysmal abdominal aorta. No pathologically enlarged lymph nodes in the abdomen or pelvis. Reproductive: Normal size prostate. Other: No pneumoperitoneum. Large volume ascites with simple fluid density. There is ascitic fluid trapped within a small umbilical hernia. Moderate anasarca. Symmetric gynecomastia. Musculoskeletal: No aggressive appearing focal osseous lesions. Mild chronic  appearing T11 vertebral compression fracture. Mild to moderate thoracolumbar spondylosis. IMPRESSION: 1. Atrophic cirrhotic liver. Indeterminate subcentimeter hypodense right liver lesion, for which short-term outpatient MRI abdomen without and with IV contrast is recommended for further evaluation. 2. Large volume ascites.  Normal size spleen. 3. Nonspecific mildly irregular circumferential wall thickening in the lower thoracic esophagus, potentially due to varices. 4. Small dependent bilateral pleural effusions. Patchy consolidation and volume loss in the dependent lower lung lobes bilaterally, favor atelectasis, cannot exclude a component of aspiration or pneumonia. 5. Moderate anasarca. 6. Mildly  dilated mid small bowel loops with air-fluid levels with no discrete small bowel caliber transition, and with progression of oral contrast to the distal colon, most consistent with mild adynamic ileus. 7. Aortic atherosclerosis.  Coronary atherosclerosis. Electronically Signed   By: Delbert PhenixJason A Poff M.D.   On: 09/28/2016 09:36   Dg Ankle Complete Left  Result Date: 09/23/2016 CLINICAL DATA:  Ulceration along the medial aspect the left ankle. EXAM: LEFT ANKLE COMPLETE - 3+ VIEW COMPARISON:  None. FINDINGS: Ulceration along the medial malleolus and medial to the distal femoral metaphysis noted. Underlying periostitis along the distal tibia medially. Mild periostitis along the distal fibula. Vascular calcifications.  Dorsal spurring of the talar head. IMPRESSION: 1. Ulceration overlying the medial malleolus, with adjacent underlying periostitis/periosteal reaction. I cannot completely exclude the possibility of early osteomyelitis given the periostitis. No bony destructive findings. There is also mild periostitis in the distal fibula. Electronically Signed   By: Gaylyn RongWalter  Liebkemann M.D.   On: 09/28/2016 17:19   Dg Abd 1 View  Result Date: 09/28/2016 CLINICAL DATA:  NG tube placement. EXAM: ABDOMEN - 1 VIEW COMPARISON:  Chest radiograph yesterday. No prior abdominal imaging. FINDINGS: Dilated small bowel in the central and lower abdomen measure up to 4.7 cm in caliber. There is gaseous gastric distention. Enteric tube in place with tip and side-port in the stomach. No evidence of pneumatosis or free air. IMPRESSION: Gaseous distention of small bowel concerning for small bowel obstruction versus ileus. Tip and side port of the enteric tube in the stomach. Electronically Signed   By: Rubye OaksMelanie  Ehinger M.D.   On: 09/28/2016 05:06   Ct Head Wo Contrast  Result Date: 09/28/2016 CLINICAL DATA:  Altered mental status, found down EXAM: CT HEAD WITHOUT CONTRAST TECHNIQUE: Contiguous axial images were obtained from the  base of the skull through the vertex without intravenous contrast. COMPARISON:  10/15/2012 FINDINGS: Brain: No evidence of acute infarction, hemorrhage, hydrocephalus, extra-axial collection or mass lesion/mass effect. Subcortical white matter and periventricular small vessel ischemic changes. Vascular: Mild intracranial atherosclerosis. Skull: Normal. Negative for fracture or focal lesion. Sinuses/Orbits: Near complete opacification of the left maxillary sinus. Mild partial opacification of bilateral sphenoid and right maxillary sinus. Mucosal thickening of the bilateral ethmoid sinuses. Mastoid air cells are clear. Other: Mild cortical atrophy. IMPRESSION: No evidence of acute intracranial abnormality. Atrophy with small vessel ischemic changes. Paranasal sinus disease, as above. Electronically Signed   By: Charline BillsSriyesh  Krishnan M.D.   On: 09/24/2016 16:59   Dg Chest Port 1 View  Result Date: 09/28/2016 CLINICAL DATA:  Central line and NG tube placement. EXAM: PORTABLE CHEST 1 VIEW COMPARISON:  Chest radiograph earlier this day at 0305 hour FINDINGS: Endotracheal tube is now 2 cm from the carina. Tip and side port of the enteric tube below the diaphragm in the stomach. Right internal jugular central venous catheter tip in the mid SVC. No evidence of pneumothorax. Low lung volumes persist. Bibasilar opacities  favor atelectasis. No evidence of pleural fluid. Unchanged heart size and mediastinal contours. IMPRESSION: 1. Right central line in the mid SVC. No pneumothorax. Endotracheal tube 2 cm from the carina. 2. Low lung volumes with bibasilar opacities favoring atelectasis. Electronically Signed   By: Rubye Oaks M.D.   On: 09/28/2016 05:05   Dg Chest Port 1 View  Result Date: 09/28/2016 CLINICAL DATA:  Acute respiratory failure, endotracheal tube placement. EXAM: PORTABLE CHEST 1 VIEW COMPARISON:  09/05/2016 CXR FINDINGS: The tip of an endotracheal tube seen slightly above the level of the clavicular  heads approximately 6.5 cm above the carina. Low lung volumes with crowding of interstitial lung markings. No pneumonic consolidation, effusion or pneumothorax. Aortic atherosclerosis is noted. The cardiac silhouette is within normal limits for size. IMPRESSION: Tip of endotracheal tube is approximately 6.5 cm above the carina. Slight advancement 1.5-2 cm suggested. Low lung volumes with mild interstitial prominence bilaterally. Electronically Signed   By: Tollie Eth M.D.   On: 09/28/2016 03:34   Dg Chest Portable 1 View  Result Date: 09/26/2016 CLINICAL DATA:  Unresponsive patient, vomiting, ulcer along the ankle, altered mental status EXAM: PORTABLE CHEST 1 VIEW COMPARISON:  Report from 01/10/2000 FINDINGS: Dilated loops of small bowel in the upper abdomen measuring up to 4.8 cm. Mild airspace opacity along the left hemidiaphragm, I favor atelectasis over pneumonia. Low lung volumes.  Bony demineralization. IMPRESSION: 1. 1. Dilated small bowel loops up to 4.8 cm, otherwise nonspecific. Small bowel obstruction not excluded. Abdominal imaging suggested. 2. Indistinct airspace opacity at the left lung base, I favor atelectasis over pneumonia although the appearance is not entirely specific. Electronically Signed   By: Gaylyn Rong M.D.   On: 09/26/2016 17:17      Assessment & Plan: Bryan Proctor is a 60 y.o. white male with ETOH abuse, hypertension, end stage liver disease, who was admitted to [facility] on 10/03/2016   1. Acute Renal Failure with metabolic acidosis: oliguric urine output. Low threshold to start dialysis. Baseline of creatinine 0.89 from 10/2015.   Will attempt to contact family to find out plan of care.  Hold diuretics.   2. Hyponatremia: due to volume overload  3. Anemia and thrombocytopenia: hemoglobin stable, will monitor. Concern for GI bleed.  Low platelets due to liver failure  4. End Stage Liver Disease: MELD of 28  Overall prognosis is poor.     LOS:  1 Quentez Lober 12/26/201710:32 AM

## 2016-09-28 NOTE — Consult Note (Signed)
WOC Nurse wound consult note Reason for Consult:NOnintact ulcers to bilateral lower extremities.  Present on admission.   Wound type:Chronic nonhealing.  Pressure Ulcer POA: Yes Measurement: Left medial malleolus.  2 nonintact lesions.  Distal 2 cm x 2 cm x 0.2 cm ruddy red wound bed.  Proximal 4 cm x 3.4 cm x 0.4 cm with ruddy red wound bed and fibrin slough present.  Wound ION:GEXBMbed:Ruddy red/fibrin slough Drainage (amount, consistency, odor) Moderate serosanguinous  No odor.  Periwound:Edema, erythema.  Nonintact scabbed lesions.  Dressing procedure/placement/frequency:Cleanse bilateral lower legs with soap and water.  Apply calcium alginate to nonintact lesions for absorption and to fill dead space. Marland Kitchen.  Apply zinc layer secured with self adherent coban.  Change Monday and Thursday.  Will not follow at this time.  Please re-consult if needed.  Maple HudsonKaren Jeovani Weisenburger RN BSN CWON Pager [phone]

## 2016-09-28 NOTE — Care Management (Addendum)
[facility] because patient's brother Jeffrey's contact information is outdated/disconnected. Jeffrey's number they have is [phone] or his wife is Vale HavenJoy Sacks [phone]. I have contacted Jeffrey's number and left urgent message for him to call ICU [phone] and ask for [facility]. I later received callback from [facility] stating that "jeffrey Ranieri" had called her back and he "does not know who this patient is". I have [facility] to notify them that this contact was incorrect. I have also updated Dr. Sung AmabileSimonds that currently there is no family contact on file. I will review "old" note from past visits to see if any family was noted.

## 2016-09-28 NOTE — Progress Notes (Signed)
Chaplain rounded the unit to provide a compassionate presence and spiritual support to the patient and family. Patient appeared to be sleeping.  Chaplain provided silent prayer. Jefm PettyChaplain Uriah Trueba [phone]

## 2016-09-28 NOTE — Care Management (Signed)
Message left on patient's home number to call ICU ext [phone].

## 2016-09-28 NOTE — Progress Notes (Signed)
Pharmacy working on albumin. It has not been sent.

## 2016-09-28 NOTE — Progress Notes (Signed)
Initial Nutrition Assessment  DOCUMENTATION CODES:   Not applicable  INTERVENTION:  If pt remains on vent support for >24-48 hours, recommend initiation of nutrition support via TF as soon as clinically able  NUTRITION DIAGNOSIS:   Inadequate oral intake related to acute illness as evidenced by NPO status.  GOAL:   Provide needs based on ASPEN/SCCM guidelines  MONITOR:   Vent status, Labs, Weight trends  REASON FOR ASSESSMENT:   Ventilator    ASSESSMENT:    60 yo male admitted unresposnive and hypothermic in shock, lactic acidosis/hypotension, AKI , hypotension. Pt with multiorgan failure requiring intubation. Pt with hx of EtOH abuse  Patient is currently intubated on ventilator support MV: 12 L/min Temp (24hrs), Avg:90 F (32.2 C), Min:85 F (29.4 C), Max:96.4 F (35.8 C)  Large amount of coffee ground emesis during and after intubation, NG-LIS  Labs: sodium 131, BUN 50, Creatinine 1.91, ammonia 173 Meds: lactulose, levophed, fentanyl/versed  Diet Order:   NPO  Skin:  Reviewed, no issues  Last BM:  no documented BM  Height:   Ht Readings from Last 1 Encounters:  08/20/16 5\' 10"  (1.778 m)    Weight:   Wt Readings from Last 1 Encounters:  05-04-2016 185 lb 13.6 oz (84.3 kg)    Filed Weights   05-04-2016 1602 05-04-2016 1943  Weight: 200 lb (90.7 kg) 185 lb 13.6 oz (84.3 kg)    BMI:  Body mass index is 26.67 kg/m.  Estimated Nutritional Needs:   Kcal:  1731 kcals  Protein:  126-168 g  Fluid:  >/= 1.7 L  EDUCATION NEEDS:   No education needs identified at this time  Bryan StarcherCate Xochilt Conant MS, RD, LDN [phone] Pager  [phone] Weekend/On-Call Pager

## 2016-09-28 NOTE — Progress Notes (Signed)
Per Dr. Wynelle LinkKolluru we will not heparin lock HD catheter due to patients platelet count.

## 2016-09-28 NOTE — Progress Notes (Signed)
Pharmacy Antibiotic Note  Bryan Proctor is a 60 y.o. male admitted on 09/09/2016 with sepsis/Ecoli bactermia/Venous stasis ulcer infection with early Proctor ankle osteomyelitis.  Pharmacy has been consulted for meropenem and vancomycin dosing.  Plan: Meropenem 1g IV Q12hr.    Vancomycin 1250mg  IV Q18hr for goal trough of 15-20. Will obtain trough prior to dose on 12/28.    Weight: 185 lb 13.6 oz (84.3 kg)  Temp (24hrs), Avg:90.6 F (32.6 C), Min:85 F (29.4 C), Max:97.9 F (36.6 C)   Recent Labs Lab 09/13/2016 1539 09/29/2016 1844 09/12/2016 2151 09/28/16 0538 09/28/16 0844  WBC 5.4  --  3.9 5.4 5.9  CREATININE 1.67*  --   --  1.91*  --   LATICACIDVEN 3.2* 2.4*  --  3.7*  --     Estimated Creatinine Clearance: 43 mL/min (by C-G formula based on SCr of 1.91 mg/dL (H)).    Allergies  Allergen Reactions  . Penicillins Nausea And Vomiting    Antimicrobials this admission: Aztreonam 12/25 >> 12/26 Levofloxacin 12/25 >> 12/25 Vancomycin 12/25 >>  Meropenem 12/26 >>  Dose adjustments this admission: N/A  Microbiology results: 12/25 BCx: Ecoli 1/2 12/25 UCx: pending  12/25 MRSA PCR: negative   Pharmacy will continue to monitor and adjust per consult.   Bryan Proctor 09/28/2016 1:48 PM

## 2016-09-28 NOTE — Progress Notes (Signed)
Dr. Emmit PomfretHugelmeyer notified that patient's BP has continued to decreased since arrival to floor. Gave verbal order for 500 NS bolus. Order placed. Trudee KusterBrandi R Mansfield

## 2016-09-28 NOTE — Progress Notes (Addendum)
Patient's BP has increased since bolus, however crackles heard during assessment. Dr. Emmit PomfretHugelmeyer notified of change and that patient has only had 20 mL of output in foley since around 2000. Gave verbal order for intensivist consult, continue IVF because of sepsis, bipap, and duonebs. Orders placed. Trudee KusterBrandi R Mansfield

## 2016-09-28 NOTE — Progress Notes (Signed)
Spoke with care Production designer, theatre/television/filmmanager. Received call back from [phone] states that he does not have any family in the area. At this time we do not have any contact info. Care manager to call Dr Bard HerbertSimmonds.

## 2016-09-28 NOTE — Consult Note (Signed)
PULMONARY / CRITICAL CARE MEDICINE   Name: Bryan Proctor MRN: [id number] DOB: December 16, 1955    ADMISSION DATE:  April 23, 2016 CONSULTATION DATE:  09/28/2016  REFERRING MD:  Dr. Emmit PomfretHugelmeyer   CHIEF COMPLAINT:  Altered Mental Status  PT PROFILE: 60 y.o. M with multiple medical problems including alcohol abuse and cirrhosis found in a pool of coffee ground emesis and initially admitted by Hospitalists for UGIB and AMS. He appeared to be labored in his breathing and was ultimately intubated for this and severe obtundation. Working diagnoses of shock due to GIB and/or sepsis with MODS including AKI.   MAJOR EVENTS/TEST RESULTS: 12/25 Admitted to Hospitalist service. [facility]ub[facility] 12/25CT head: No evidence of acute intracranial abnormality. Atrophy with small vessel ischemic changes. 12/25 L ankle Xray: Ulceration overlying the medial malleolus, with adjacent underlying periostitis/periosteal reaction. Possible of early osteomyelitis 12/26 Nephrology consultation 12/26 CTAP: Atrophic cirrhotic liver. Large volume ascites. Nonspecific mildly irregular circumferential wall thickening in the lower thoracic esophagus, potentially due to varices. Moderate anasarca. Mildly dilated mid small bowel loops with air-fluid levels with no discrete small bowel caliber transition, and with progression of oral contrast to the distal colon, most consistent with mild adynamic ileus.  INDWELLING DEVICES:: R IJ CVL 12/26 >>  ETT 12/26 >>   MICRO DATA: MRSA PCR 12/25 >> NEG Urine 12/25 >>   Blood 12/25 >>   ANTIMICROBIALS:  Vancomycin 12/26 >>  Meropenem 12/26 >>    HISTORY OF PRESENT ILLNESS:   This is a [age] yo male with a PMH of Insomnia, HTN, ETOH abuse, and ADHD.  [facility]25 he was found on the floor by his friend surrounded by blood and vomit prior to presentation to the ER.  Per ER notes old records indicate he has a history of drinking alcohol.   Upon presentation to the ER the pt was hypothermic with a rectal temp of 83 degrees F and he was hemoccult positive.  He was initially placed on bipap due to acute respiratory failure, however due to worsening acute respiratory failure PCCM was consulted 12/26 for further management of septic shock with hypotension secondary to periostitis/periosteal reaction left lower extremity and ?osteomylitis vs. Cellulitis of  left lower extremity, hepatic encephalopathy, and acute respiratory failure secondary to aspiration pneumonia requiring mechanical ventilation.  PAST MEDICAL HISTORY :  He  has a past medical history of ADHD (attention deficit hyperactivity disorder); Alcoholism (HCC); Hypertension; and Insomnia.  PAST SURGICAL HISTORY: He  has a past surgical history that includes Knee surgery (Left, 2000, 2002).  Allergies  Allergen Reactions  . Penicillins Nausea And Vomiting    No current facility-administered medications on file prior to encounter.    Current Outpatient Prescriptions on File Prior to Encounter  Medication Sig  . diazepam (VALIUM) 5 MG tablet Take 1 tablet (5 mg total) by mouth at bedtime as needed for anxiety. (Patient not taking: Reported on 08/20/2016)  . folic acid (FOLVITE) 1 MG tablet Take 1 mg by mouth daily. Reported on 11/17/2015  . LORazepam (ATIVAN) 1 MG tablet TAKE ONE TABLET BY MOUTH EVERY 6 HOURS AS NEEDED FOR INSOMNIA, NERVOUSNESS AND ELEVATED BLOOD PRESSURE (Patient not taking: Reported on 08/20/2016)  . metoprolol tartrate (LOPRESSOR) 25 MG tablet Take 1 tablet (25 mg total) by mouth 2 (two) times daily. (Patient not taking: Reported on 08/20/2016)  . spironolactone (ALDACTONE) 50 MG tablet Take 1 tablet (50 mg total) by mouth daily.  Marland Kitchen. thiamine 100  MG tablet Take 100 mg by mouth daily. Reported on 11/17/2015    FAMILY HISTORY:  His indicated that his mother is deceased. He indicated that his father is deceased.    SOCIAL HISTORY: He  reports that he has  quit smoking. He has never used smokeless tobacco. He reports that he drinks alcohol. He reports that he uses drugs.  REVIEW OF SYSTEMS:   Unable to assess pt intubated  SUBJECTIVE:  Unable to assess pt intubated  VITAL SIGNS: BP (!) 88/58   Pulse (!) 111   Temp (!) 96.4 F (35.8 C) (Rectal)   Resp (!) 31   Wt 185 lb 13.6 oz (84.3 kg)   SpO2 96%   BMI 26.67 kg/m   HEMODYNAMICS:    VENTILATOR SETTINGS:    INTAKE / OUTPUT: I/O last 3 completed shifts: In: 2000 [I.V.:1000; IV Piggyback:1000] Out: -   PHYSICAL EXAMINATION: General:  Chronically ill appearing Caucasian male Neuro:  Unresponsive to stimulation, sedated, PERRL HEENT:  Supple, JVD present Cardiovascular:  Sinus tach, s1s2, no M/R/G Lungs:  Rhonchi throughout, even, non labored, mechanically intubated Abdomen:  Distended, taut, hypoactive BS x4 Musculoskeletal:  3+ bilateral lower extremity pitting edema Skin:  Left lower extremity ulceration 4 x 3 centimeters pink tinged with eschar present peri wound erythematous currently ota no drainage present, scattered scabbed over abrasions bilateral lower extremities, scattered generalized ecchymosis   LABS:  BMET  Recent Labs Lab 09/21/2016 1539  NA 129*  K 4.7  CL 98*  CO2 20*  BUN 52*  CREATININE 1.67*  GLUCOSE 106*    Electrolytes  Recent Labs Lab 10/03/2016 1539  CALCIUM 9.2    CBC  Recent Labs Lab 10/01/2016 1539 09/19/2016 1844 09/20/2016 2151  WBC 5.4  --  3.9  HGB 11.9* 9.4* 9.0*  HCT 33.7* 26.8* 26.1*  PLT 94*  --  78*    Coag's  Recent Labs Lab 09/12/2016 1539 09/23/2016 1844  APTT 46*  --   INR 1.63 2.08    Sepsis Markers  Recent Labs Lab 09/22/2016 1539 09/11/2016 1844  LATICACIDVEN 3.2* 2.4*  PROCALCITON  --  0.86    ABG  Recent Labs Lab 09/23/2016 1535  PHART 7.42  PCO2ART 29*  PO2ART 94    Liver Enzymes  Recent Labs Lab 09/15/2016 1539  AST 77*  ALT 31  ALKPHOS 80  BILITOT 2.5*  ALBUMIN 2.0*    Cardiac  Enzymes  Recent Labs Lab 09/17/2016 1539  TROPONINI <0.03    Glucose  Recent Labs Lab 09/24/2016 1919  GLUCAP 152*    CXR: Low lung volumes with bibasilar opacities favoring atelectasis  ASSESSMENT / PLAN:  PULMONARY A: Acute respiratory failure due to AMS P:   Vent settings established Vent bundle implemented Daily SBT as indicated Empiric bronchodilators  CARDIOVASCULAR A:  Septic shock with lactic acidosis Hx: HTN P:  Vasopressors to maintain MAP > 65 mmHg  RENAL A:   AKI Mild metabolic acidosis Mild hyponatremia Anuria P:   Renal Consultation Monitor BMET intermittently Monitor I/Os Correct electrolytes as indicated Will probably need CRRT but he is a very poor candidate for aggressive ICU care  Would like to discuss with family of HCPOA prior to initiating   Unreachable by phone presently  GASTROINTESTINAL A:   Severe liver cirrhosis Severe ascites UGIB - concern for variceal bleeding Adynamic ileus P:   SUP PPI infusion Consider octreotide and GI consult for continued bleeding No TFs for now US guided paracentesis per IR today -  diagnostic  HEMATOLOGIC A:   Acute blood loss anemia Thrombocytopenia Coagulopathy of liver disease P:  DVT px: SQ heparin Monitor CBC intermittently Transfuse per usual guidelines  INFECTIOUS A:   Severe sepsis E coli bacteremia Concern for SBP Venous stasis ulcer infection with early L ankle osteomyelitis P:   Monitor temp, WBC count Micro and abx as above  ENDOCRINE A:   High risk of hypoglycemia due to liver and renal disease P:   Monitor serum glucose q 4 hrs Avoid insulin unless CBG > 200  NEUROLOGIC A:   Hepatic and septic encephalopathy  P:   RASS goal: 0 to -1 Fentanyl gtt to maintain RASS goal and for pain management Prn Versed and Fentanyl  Repeat Ammonia level 12/26 WUA daily Continue Lactulose and Xifaxan  Monitor for s/sx of ETOH withdrawal   FAMILY  Updates: No family can  be reached  CCM time: 60 mins The above time includes time spent in consultation with patient and/or family members and reviewing care plan on multidisciplinary rounds  Billy Fischer, MD PCCM service Mobile [phone] Pager [phone]

## 2016-09-28 NOTE — Procedures (Signed)
Central Venous Catheter Insertion Procedure Note Georgiann MohsRoger W Dyar [id number] [birth year]  Procedure: Insertion of Central Venous Catheter Indications: Assessment of intravascular volume, Drug and/or fluid administration and Frequent blood sampling  Procedure Details Consent: Risks of procedure as well as the alternatives and risks of each were explained to the (patient/caregiver).  Consent for procedure obtained. Time Out: Verified patient identification, verified procedure, site/side was marked, verified correct patient position, special equipment/implants available, medications/allergies/relevent history reviewed, required imaging and test results available.  Performed  Maximum sterile technique was used including antiseptics, cap, gloves, gown, hand hygiene, mask and sheet. Skin prep: Chlorhexidine; local anesthetic administered A antimicrobial bonded/coated triple lumen catheter was placed in the right internal jugular vein using the Seldinger technique.  Evaluation Blood flow good Complications: No apparent complications Patient did tolerate procedure well. Chest X-ray ordered to verify placement.  CXR: pending.  Right internal jugular central line placed utilizing ultrasound no complications noted during procedure.  Sonda Rumbleana Blakeney, AGNP  Pulmonary/Critical Care Pager [phone] (please enter 7 digits) PCCM Consult Pager [phone] (please enter 7 digits)  [name] Trueman Worlds, MD PCCM service Mobile [phone] Pager [phone] 09/28/2016

## 2016-09-28 NOTE — Clinical Social Work Note (Addendum)
CSW consulted for possible self neglect. Patient currently on vent and RN CM [facility] to contact ICU urgently. Patient was just seen in Dr. Theodis AguasFisher's office on 11/17 and nothing was documented at that time about concerns about patient's appearance or inability to care for himself. It was noted that patient is an alcoholic and is probably having some liver disease and may need paracentesis if ascites became worse. Patient's current condition might be due to his alcoholism even though patient told Dr. Sherrie MustacheFisher he does not drink heavily anymore. CSW will continue to follow and eventually complete full assessment. York SpanielMonica Maryori Weide MSW,LCSW [phone]

## 2016-09-28 NOTE — Procedures (Signed)
Endotracheal Intubation Procedure Note  Indication for endotracheal intubation: respiratory failure. Airway Assessment: Mallampati Class: II (hard and soft palate, upper portion of tonsils anduvula visible). Sedation: fentanyl and midazolam. Paralytic: rocuronium. Lidocaine: no. Atropine: no. Equipment: Macintosh 2 laryngoscope blade. Cricoid Pressure: no. Number of attempts: 2. ETT location confirmed by by auscultation, by CXR and ETCO2 monitor.  ETT placed by respiratory therapist due to worsening respiratory failure no complications noted during procedure.  Bryan Proctor, AGNP  Pulmonary/Critical Care Pager [phone] (please enter 7 digits) PCCM Consult Pager [phone] (please enter 7 digits)   [name] [name], MD PCCM service Mobile [phone] Pager [phone] 09/28/2016

## 2016-09-28 NOTE — Procedures (Signed)
Patient is unable to give consent and no family available for consent.  Discussed with Dr. [name] Simonds and the procedure is medically necessary.  Therefore, performed the procedure with Emergency Consent.    US guided paracentesis performed in RLQ.  Removed 2 liters of yellow ascites without complication.

## 2016-09-28 NOTE — Progress Notes (Signed)
eLink Physician-Brief Progress Note Patient Name: Bryan MohsRoger W Proctor DOB: April 16, 1956 MRN: [id number]   Date of Service  09/28/2016  HPI/Events of Note  resp failure from aspiration pneumonia with acute ETOH abuse with probable GIB  eICU Interventions  1.ICU admision 2.vent support 3.watch for DT's     Intervention Category Evaluation Type: New Patient Evaluation  Bryan Proctor 09/28/2016, 3:07 AM

## 2016-09-28 NOTE — Progress Notes (Signed)
PHARMACY - PHYSICIAN COMMUNICATION CRITICAL VALUE ALERT - BLOOD CULTURE IDENTIFICATION (BCID)  No results found for this or any previous visit.   BCID: E. Coli, no KPC.  Name of physician (or Provider) Contacted: Pyreddy  Changes to prescribed antibiotics required: Aztreonam changed to meropenem per protocol.  Letia Guidry S 09/28/2016  7:14 AM

## 2016-09-29 ENCOUNTER — Inpatient Hospital Stay
Admit: 2016-09-29 | Discharge: 2016-09-29 | Disposition: A | Discharge status: Home / Self Care | Payer: 59 | Attending: Pulmonary Disease | Admitting: Pulmonary Disease

## 2016-09-29 ENCOUNTER — Inpatient Hospital Stay: Payer: 59

## 2016-09-29 DIAGNOSIS — R7881 Bacteremia: Secondary | ICD-10-CM

## 2016-09-29 LAB — BASIC METABOLIC PANEL
ANION GAP: 8 (ref 5–15)
BUN: 56 mg/dL — ABNORMAL HIGH (ref 6–20)
CALCIUM: 8.1 mg/dL — AB (ref 8.9–10.3)
CO2: 19 mmol/L — AB (ref 22–32)
CREATININE: 2.72 mg/dL — AB (ref 0.61–1.24)
Chloride: 102 mmol/L (ref 101–111)
GFR calc Af Amer: 28 mL/min — ABNORMAL LOW (ref >60)
GFR, EST NON AFRICAN AMERICAN: 24 mL/min — AB (ref >60)
GLUCOSE: 105 mg/dL — AB (ref 65–99)
Potassium: 4.7 mmol/L (ref 3.5–5.1)
Sodium: 129 mmol/L — ABNORMAL LOW (ref 135–145)

## 2016-09-29 LAB — HEPATIC FUNCTION PANEL
ALBUMIN: 2 g/dL — AB (ref 3.5–5.0)
ALK PHOS: 62 U/L (ref 38–126)
ALT: 29 U/L (ref 17–63)
AST: 78 U/L — ABNORMAL HIGH (ref 15–41)
BILIRUBIN DIRECT: 1.1 mg/dL — AB (ref 0.1–0.5)
BILIRUBIN INDIRECT: 1.5 mg/dL — AB (ref 0.3–0.9)
BILIRUBIN TOTAL: 2.6 mg/dL — AB (ref 0.3–1.2)
Total Protein: 5.6 g/dL — ABNORMAL LOW (ref 6.5–8.1)

## 2016-09-29 LAB — CBC
HEMATOCRIT: 27 % — AB (ref 40.0–52.0)
HEMOGLOBIN: 9.3 g/dL — AB (ref 13.0–18.0)
MCH: 34.7 pg — AB (ref 26.0–34.0)
MCHC: 34.3 g/dL (ref 32.0–36.0)
MCV: 101 fL — AB (ref 80.0–100.0)
Platelets: 75 10*3/uL — ABNORMAL LOW (ref 150–440)
RBC: 2.67 MIL/uL — ABNORMAL LOW (ref 4.40–5.90)
RDW: 15.8 % — AB (ref 11.5–14.5)
WBC: 14.4 10*3/uL — ABNORMAL HIGH (ref 3.8–10.6)

## 2016-09-29 LAB — RENAL FUNCTION PANEL
Albumin: 1.8 g/dL — ABNORMAL LOW (ref 3.5–5.0)
Anion gap: 7 (ref 5–15)
BUN: 51 mg/dL — AB (ref 6–20)
CHLORIDE: 106 mmol/L (ref 101–111)
CO2: 18 mmol/L — AB (ref 22–32)
CREATININE: 2.66 mg/dL — AB (ref 0.61–1.24)
Calcium: 7.2 mg/dL — ABNORMAL LOW (ref 8.9–10.3)
GFR calc Af Amer: 29 mL/min — ABNORMAL LOW (ref >60)
GFR, EST NON AFRICAN AMERICAN: 25 mL/min — AB (ref >60)
Glucose, Bld: 97 mg/dL (ref 65–99)
POTASSIUM: 4.1 mmol/L (ref 3.5–5.1)
Phosphorus: 2.7 mg/dL (ref 2.5–4.6)
Sodium: 131 mmol/L — ABNORMAL LOW (ref 135–145)

## 2016-09-29 LAB — GLUCOSE, CAPILLARY
GLUCOSE-CAPILLARY: 110 mg/dL — AB (ref 65–99)
Glucose-Capillary: 136 mg/dL — ABNORMAL HIGH (ref 65–99)

## 2016-09-29 LAB — ECHOCARDIOGRAM COMPLETE: Weight: 2962.98 oz

## 2016-09-29 LAB — MAGNESIUM: Magnesium: 1.5 mg/dL — ABNORMAL LOW (ref 1.7–2.4)

## 2016-09-29 LAB — APTT: aPTT: 51 seconds — ABNORMAL HIGH (ref 24–36)

## 2016-09-29 LAB — PROTIME-INR
INR: 2.45
Prothrombin Time: 27 seconds — ABNORMAL HIGH (ref 11.4–15.2)

## 2016-09-29 MED ORDER — SODIUM CHLORIDE 0.9 % IV SOLN
1.0000 g | Freq: Two times a day (BID) | INTRAVENOUS | Status: DC
  Filled 2016-09-29 (×2): qty 1

## 2016-09-29 MED ORDER — FAMOTIDINE IN NACL 20-0.9 MG/50ML-% IV SOLN
20.0000 mg | INTRAVENOUS | Status: DC
  Administered 2016-09-29 – 2016-09-30 (×2): 20 mg via INTRAVENOUS
  Filled 2016-09-29 (×2): qty 50

## 2016-09-29 MED ORDER — VITAL HIGH PROTEIN PO LIQD
1000.0000 mL | ORAL | Status: DC
  Administered 2016-09-29 (×2)
  Administered 2016-09-29: 1000 mL
  Administered 2016-09-29 (×3)
  Administered 2016-09-30: 1000 mL

## 2016-09-29 MED ORDER — VANCOMYCIN HCL IN DEXTROSE 750-5 MG/150ML-% IV SOLN
750.0000 mg | INTRAVENOUS | Status: DC
  Filled 2016-09-29: qty 150

## 2016-09-29 MED ORDER — ALTEPLASE 2 MG IJ SOLR
2.0000 mg | Freq: Once | INTRAMUSCULAR | Status: AC
Start: 2016-09-29 — End: 2016-09-29
  Administered 2016-09-29: 2 mg
  Filled 2016-09-29: qty 2

## 2016-09-29 MED ORDER — PUREFLOW DIALYSIS SOLUTION
INTRAVENOUS | Status: DC
  Administered 2016-09-29: 3 via INTRAVENOUS_CENTRAL

## 2016-09-29 MED ORDER — SODIUM CHLORIDE 0.9 % IV SOLN
Freq: Once | INTRAVENOUS | Status: AC
  Administered 2016-09-29: 21:00:00 via INTRAVENOUS

## 2016-09-29 MED ORDER — MAGNESIUM SULFATE 2 GM/50ML IV SOLN
2.0000 g | Freq: Once | INTRAVENOUS | Status: AC
  Administered 2016-09-29: 2 g via INTRAVENOUS
  Filled 2016-09-29: qty 50

## 2016-09-29 MED ORDER — HEPARIN (PORCINE) 2000 UNITS/L FOR CRRT
INTRAVENOUS_CENTRAL | Status: DC | PRN
  Filled 2016-09-29: qty 1000

## 2016-09-29 MED ORDER — VITAMIN K1 10 MG/ML IJ SOLN
10.0000 mg | Freq: Every day | INTRAVENOUS | Status: DC
  Administered 2016-09-29 – 2016-09-30 (×2): 10 mg via INTRAVENOUS
  Filled 2016-09-29 (×4): qty 1

## 2016-09-29 MED ORDER — FENTANYL CITRATE (PF) 100 MCG/2ML IJ SOLN: 25.0000 ug | INTRAMUSCULAR | Status: DC | PRN

## 2016-09-29 MED ORDER — MEROPENEM-SODIUM CHLORIDE 1 GM/50ML IV SOLR
1.0000 g | Freq: Two times a day (BID) | INTRAVENOUS | Status: DC
  Administered 2016-09-29 – 2016-09-30 (×3): 1 g via INTRAVENOUS
  Filled 2016-09-29 (×4): qty 50

## 2016-09-29 MED ORDER — SODIUM CHLORIDE 0.9 % FOR CRRT
INTRAVENOUS_CENTRAL | Status: DC | PRN
  Filled 2016-09-29: qty 1000

## 2016-09-29 NOTE — Progress Notes (Signed)
PULMONARY / CRITICAL CARE MEDICINE   Name: Bryan MohsRoger W Proctor MRN: [id number] DOB: August 14, 1956    ADMISSION DATE:  09/28/2016 CONSULTATION DATE:  09/28/2016  REFERRING MD:  Dr. Emmit PomfretHugelmeyer   CHIEF COMPLAINT:  Altered Mental Status  PT PROFILE: 60 y.o. M with multiple medical problems including alcohol abuse and cirrhosis found in a pool of coffee ground emesis and initially admitted by Hospitalists for UGIB and AMS. He appeared to be labored in his breathing and was ultimately intubated for this and severe obtundation. Working diagnoses of shock due to GIB and/or sepsis with MODS including AKI.   MAJOR EVENTS/TEST RESULTS: 12/25 Admitted to Hospitalist service. Subsequently [facility] 12/25CT head: No evidence of acute intracranial abnormality. Atrophy with small vessel ischemic changes. 12/25 L ankle Xray: Ulceration overlying the medial malleolus, with adjacent underlying periostitis/periosteal reaction. Possible of early osteomyelitis 12/26 Nephrology consultation 12/26 CTAP: Atrophic cirrhotic liver. Large volume ascites. Nonspecific mildly irregular circumferential wall thickening in the lower thoracic esophagus, potentially due to varices. Moderate anasarca. Mildly dilated mid small bowel loops with air-fluid levels with no discrete small bowel caliber transition, and with progression of oral contrast to the distal colon, most consistent with mild adynamic ileus. 12/26 Increasing vasopressor requirements. Vasopressin added 12/26 @ liters removed by paracentesis. Fluid not c/w SBP (WBC 37, albumin < 1.0)  12/27 remains comatose, CRRT initiated 12/27 Echocardiogram:   INDWELLING DEVICES:: R IJ CVL 12/26 >>  ETT 12/26 >>  L femoral HD cath 12/26 >>   MICRO DATA: MRSA PCR 12/25 >> NEG Urine 12/25 >> NEG Blood 12/25 >> 1/2 E coli Peritoneal fluid 12/26 >>   ANTIMICROBIALS:  Vancomycin 12/26 >> 12/27 Meropenem 12/26 >>    SUBJECTIVE:  RASS -4, -5.  Minimal spontaneous movement  VITAL SIGNS: BP 106/67   Pulse 76   Temp 97.7 F (36.5 C) (Rectal)   Resp 18   Wt 185 lb 3 oz (84 kg)   SpO2 99%   BMI 26.57 kg/m   HEMODYNAMICS: CVP:  [11 mmHg-17 mmHg] 17 mmHg  VENTILATOR SETTINGS: Vent Mode: PRVC FiO2 (%):  [30 %-40 %] 30 % Set Rate:  [15 bmp] 15 bmp Vt Set:  [500 mL] 500 mL PEEP:  [5 cmH20] 5 cmH20  INTAKE / OUTPUT: I/O last 3 completed shifts: In: 4568.3 [I.V.:3168.3; Other:500; IV Piggyback:900] Out: 1085 [Urine:485; Emesis/NG output:600]  PHYSICAL EXAMINATION: General:  RASS -4, not F/C Neuro:  PERRL, no spontaneous movement, no withdrawal HEENT:  NCAT, mild scleral icterus Cardiovascular:  Sinus tach, s1s2, no M/R/G Lungs: few rhonchi, no wheezes Abdomen:  Distended, , + tympani, decreased BS Musculoskeletal:  3-4+ bilateral lower extremity pitting edema Skin:  LLE ulceration 4 x 3 centimeters above medial malleolus     LABS:  BMET  Recent Labs Lab 09/28/16 0538 09/29/16 0511 09/29/16 0849  NA 131* 129* 131*  K 4.7 4.7 4.1  CL 104 102 106  CO2 17* 19* 18*  BUN 50* 56* 51*  CREATININE 1.91* 2.72* 2.66*  GLUCOSE 76 105* 97    Electrolytes  Recent Labs Lab 09/28/16 0538 09/29/16 0511 09/29/16 0849  CALCIUM 8.4* 8.1* 7.2*  MG  --   --  1.5*  PHOS  --   --  2.7    CBC  Recent Labs Lab 09/28/16 1600 09/28/16 2229 09/29/16 0511  WBC 13.3* 14.0* 14.4*  HGB 10.1* 9.1* 9.3*  HCT 28.8* 26.4* 27.0*  PLT 97* 77* 75*    Coag's  Recent  Labs Lab 09/08/16 1539 09/08/16 1844 09/29/16 0511  APTT 46*  --  51*  INR 1.63 2.08 2.45    Sepsis Markers  Recent Labs Lab 09/08/16 1539 09/08/16 1844 09/28/16 0538  LATICACIDVEN 3.2* 2.4* 3.7*  PROCALCITON  --  0.86  --     ABG  Recent Labs Lab 09/08/16 1535 09/28/16 0243  PHART 7.42 7.32*  PCO2ART 29* 34  PO2ART 94 80*    Liver Enzymes  Recent Labs Lab 09/08/16 1539 09/28/16 0538 09/29/16 0511 09/29/16 0849  AST 77* 82*  78*  --   ALT 31 30 29   --   ALKPHOS 80 75 62  --   BILITOT 2.5* 2.8* 2.6*  --   ALBUMIN 2.0* 1.8* 2.0* 1.8*    Cardiac Enzymes  Recent Labs Lab 09/08/16 1539  TROPONINI <0.03    Glucose  Recent Labs Lab 09/08/16 1919 09/29/16 0326  GLUCAP 152* 136*    CXR: bibasilar atx. No definite infiltrate  ASSESSMENT / PLAN:  PULMONARY A: Acute respiratory failure due to AMS P:   Cont full vent support - settings reviewed and/or adjusted Cont vent bundle Daily SBT if/when meets criteria Follow CXR intermittently  CARDIOVASCULAR A:  Septic shock Lactic acidosis Hx: HTN P:  Cont NE/vasopressin to maintain MAP > 65 mmHg Echo results pending  RENAL A:   AKI, anuric Metabolic acidosis, mild Mild hyponatremia P:   Monitor BMET intermittently Monitor I/Os Correct electrolytes as indicated CRRT per Renal Service  GASTROINTESTINAL A:   Severe liver cirrhosis Severe ascites UGIB, resolved Adynamic ileus P:   SUP: famotidine IV Begin trickle feeds 12/27  HEMATOLOGIC A:   Acute blood loss anemia - no overt bleeding presently Thrombocytopenia Coagulopathy of liver disease P:  DVT px: SCDs Monitor CBC intermittently Transfuse per usual guidelines Vitamin K X 3 doses beginning 12/27  INFECTIOUS A:   Severe sepsis E coli bacteremia - source unclear.  Venous stasis ulcer infection with early L ankle osteomyelitis P:   Monitor temp, WBC count Micro and abx as above  ENDOCRINE A:   High risk of hypoglycemia due to liver and renal disease P:   Monitor serum glucose q 4 hrs Avoid insulin unless CBG > 200  NEUROLOGIC A:   Hepatic and septic encephalopathy  P:   RASS goal: 0 to -1 Prn Versed and Fentanyl  Repeat Ammonia level 12/26 WUA daily Continue Lactulose and Xifaxan  Monitor for s/sx of ETOH withdrawal   FAMILY  No family can be reached. Dr Wynelle LinkKolluru and I discussed plan of care. If family cannot be reached and if there is no dicernible  improvement after 3 days of vent support and CRRT, we will discuss withdrawal of care  CCM time: 40 mins The above time includes time spent in consultation with patient and/or family members and reviewing care plan on multidisciplinary rounds  [name] Wrenly Lauritsen, MD PCCM service Mobile [phone] Pager [phone]

## 2016-09-29 NOTE — Progress Notes (Addendum)
Lab has notified this nurse after 2 lab draws (from central line and venipuncture) that both hemolyzed and unable to result, cbg also unable to obtain. Bryan Harmanana, NP and Dr. Ronn MelenaKolloru notified. Plasma currently running, ordered to try another draw after plasma is finished.  Trudee KusterBrandi R Mansfield

## 2016-09-29 NOTE — Progress Notes (Signed)
Difficult time initiating CRRT.  Arterial access with high pressures, clots upon pull back. Notified Dr. Wynelle LinkKolluru, MD order for Cathflo administration, to dwell for 45 minutes. Able to initiate CRRT/CVVHD: 2K bath, 350 BF, 2L TF.  Patient has been off of Fentanyl drip for 5 hours, no response to verbal stimulation, withdraws to pain.  (+) Gag, cough. PERLA.  TF initiated at 20 ml/hr with 30 ml flush q 4.  No family or friend contact concerning welfare of patient.  100 ml UOP this shift.

## 2016-09-29 NOTE — Progress Notes (Signed)
[facility]  ROUNDING NOTE   Subjective:   Placed on CRRT this morning.  Anuric Tmin 96.3  No family can be found.   Norepinephrine and vasopressin  Objective:  Vital signs in last 24 hours:  Temp:  [96.8 F (36 C)-99.1 F (37.3 C)] 97.7 F (36.5 C) (12/27 0700) Pulse Rate:  [75-109] 76 (12/27 0700) Resp:  [14-24] 18 (12/27 0700) BP: (78-106)/(53-67) 106/67 (12/27 0700) SpO2:  [95 %-99 %] 99 % (12/27 0802) FiO2 (%):  [30 %-40 %] 30 % (12/27 0802) Weight:  [84 kg (185 lb 3 oz)] 84 kg (185 lb 3 oz) (12/27 0600)  Weight change: -6.719 kg (-14 lb 13 oz) Filed Weights   09/26/2016 1602 09/14/2016 1943 09/29/16 0600  Weight: 90.7 kg (200 lb) 84.3 kg (185 lb 13.6 oz) 84 kg (185 lb 3 oz)    Intake/Output: I/O last 3 completed shifts: In: 4568.3 [I.V.:3168.3; Other:500; IV [id number]:938] Out: 1085 [Urine:485; Emesis/NG output:600]   Intake/Output this shift:  No intake/output data recorded.  Physical Exam: General: Critically ill  Head: ETT  Eyes: +icterus  Neck: Supple, trachea midline  Lungs:  Mechanical ventilation PRVC FiO2 30%  Heart: Regular rate and rhythm  Abdomen:  +ascites  Extremities: 1+ peripheral edema.  Neurologic: Nonfocal, moving all four extremities  Skin: No lesions  Access: Left temp HD catheter 12/26 Dr. Leonidas Romberg    Basic Metabolic Panel:  Recent Labs Lab 09/22/2016 1539 09/28/16 0538 09/29/16 0511 09/29/16 0849  NA 129* 131* 129* 131*  K 4.7 4.7 4.7 4.1  CL 98* 104 102 106  CO2 20* 17* 19* 18*  GLUCOSE 106* 76 105* 97  BUN 52* 50* 56* 51*  CREATININE 1.67* 1.91* 2.72* 2.66*  CALCIUM 9.2 8.4* 8.1* 7.2*  MG  --   --   --  1.5*  PHOS  --   --   --  2.7    Liver Function Tests:  Recent Labs Lab 09/11/2016 1539 09/28/16 0538 09/29/16 0511 09/29/16 0849  AST 77* 82* 78*  --   ALT 31 30 29   --   ALKPHOS 80 75 62  --   BILITOT 2.5* 2.8* 2.6*  --   PROT 6.5 6.0* 5.6*  --   ALBUMIN 2.0* 1.8* 2.0* 1.8*    Recent  Labs Lab 09/03/2016 1539  LIPASE 71*    Recent Labs Lab 09/17/2016 1539 09/28/16 0844  AMMONIA 127* 173*    CBC:  Recent Labs Lab 09/24/2016 1539  09/28/16 0538 09/28/16 0844 09/28/16 1600 09/28/16 2229 09/29/16 0511  WBC 5.4  < > 5.4 5.9 13.3* 14.0* 14.4*  NEUTROABS 4.4  --   --   --   --   --   --   HGB 11.9*  < > 11.2* 10.7* 10.1* 9.1* 9.3*  HCT 33.7*  < > 31.9* 30.8* 28.8* 26.4* 27.0*  MCV 100.2*  < > 101.2* 100.1* 100.2* 99.5 101.0*  PLT 94*  < > 109* 109* 97* 77* 75*  < > = values in this interval not displayed.  Cardiac Enzymes:  Recent Labs Lab 09/15/2016 1539  CKTOTAL 239  TROPONINI <0.03    BNP: Invalid input(s): POCBNP  CBG:  Recent Labs Lab 09/11/2016 1919 09/29/16 0326  GLUCAP 152* 136*    Microbiology: Results for orders placed or performed during the hospital encounter of 09/22/2016  Blood Culture (routine x 2)     Status: None (Preliminary result)   Collection Time: 09/06/2016  3:39 PM  Result Value  Ref Range Status   Specimen Description BLOOD R AC  Final   Special Requests   Final    BOTTLES DRAWN AEROBIC AND ANAEROBIC  AER 10 ANA 16ML   Culture  Setup Time   Final    Organism ID to follow GRAM NEGATIVE RODS ANAEROBIC BOTTLE ONLY CRITICAL RESULT CALLED TO, READ BACK BY AND VERIFIED WITH: MATT Union ON 09/28/16 AT 6837 QSD    Culture GRAM NEGATIVE RODS  Final   Report Status PENDING  Incomplete  Blood Culture (routine x 2)     Status: None (Preliminary result)   Collection Time: 09/12/2016  3:39 PM  Result Value Ref Range Status   Specimen Description BLOOD L AC  Final   Special Requests BOTTLES DRAWN AEROBIC AND ANAEROBIC ANA 9 AER 10ML  Final   Culture NO GROWTH 2 DAYS  Final   Report Status PENDING  Incomplete  Urine culture     Status: None   Collection Time: 09/10/2016  3:39 PM  Result Value Ref Range Status   Specimen Description URINE, RANDOM  Final   Special Requests NONE  Final   Culture NO GROWTH Performed at [facility]   Final   Report Status 09/28/2016 FINAL  Final  Blood Culture ID Panel (Reflexed)     Status: Abnormal   Collection Time: 09/20/2016  3:39 PM  Result Value Ref Range Status   Enterococcus species NOT DETECTED NOT DETECTED Final   Listeria monocytogenes NOT DETECTED NOT DETECTED Final   Staphylococcus species NOT DETECTED NOT DETECTED Final   Staphylococcus aureus NOT DETECTED NOT DETECTED Final   Streptococcus species NOT DETECTED NOT DETECTED Final   Streptococcus agalactiae NOT DETECTED NOT DETECTED Final   Streptococcus pneumoniae NOT DETECTED NOT DETECTED Final   Streptococcus pyogenes NOT DETECTED NOT DETECTED Final   Acinetobacter baumannii NOT DETECTED NOT DETECTED Final   Enterobacteriaceae species DETECTED (A) NOT DETECTED Final    Comment: CRITICAL RESULT CALLED TO, READ BACK BY AND VERIFIED WITH: MATT MCBANE ON 09/28/16 AT 0647 QSD    Enterobacter cloacae complex NOT DETECTED NOT DETECTED Final   Escherichia coli DETECTED (A) NOT DETECTED Final    Comment: CRITICAL RESULT CALLED TO, READ BACK BY AND VERIFIED WITH: MATT MCBANE ON 09/28/16 AT 0647 QSD    Klebsiella oxytoca NOT DETECTED NOT DETECTED Final   Klebsiella pneumoniae NOT DETECTED NOT DETECTED Final   Proteus species NOT DETECTED NOT DETECTED Final   Serratia marcescens NOT DETECTED NOT DETECTED Final   Carbapenem resistance NOT DETECTED NOT DETECTED Final   Haemophilus influenzae NOT DETECTED NOT DETECTED Final   Neisseria meningitidis NOT DETECTED NOT DETECTED Final   Pseudomonas aeruginosa NOT DETECTED NOT DETECTED Final   Candida albicans NOT DETECTED NOT DETECTED Final   Candida glabrata NOT DETECTED NOT DETECTED Final   Candida krusei NOT DETECTED NOT DETECTED Final   Candida parapsilosis NOT DETECTED NOT DETECTED Final   Candida tropicalis NOT DETECTED NOT DETECTED Final  MRSA PCR Screening     Status: None   Collection Time: 09/15/2016  7:30 PM  Result Value Ref Range Status   MRSA by PCR  NEGATIVE NEGATIVE Final    Comment:        The GeneXpert MRSA Assay (FDA approved for NASAL specimens only), is one component of a comprehensive MRSA colonization surveillance program. It is not intended to diagnose MRSA infection nor to guide or monitor treatment for MRSA infections.   Body fluid culture  Status: None (Preliminary result)   Collection Time: 09/28/16  3:55 PM  Result Value Ref Range Status   Specimen Description PERITONEAL  Final   Special Requests NONE  Final   Gram Stain   Final    FEW WBC PRESENT, PREDOMINANTLY MONONUCLEAR NO ORGANISMS SEEN Performed at [facility]    Culture PENDING  Incomplete   Report Status PENDING  Incomplete    Coagulation Studies:  Recent Labs  09/04/2016 1539 09/06/2016 1844 09/29/16 0511  LABPROT 19.5* 23.7* 27.0*  INR 1.63 2.08 2.45    Urinalysis:  Recent Labs  09/20/2016 1539  COLORURINE AMBER*  LABSPEC 1.016  PHURINE 5.0  GLUCOSEU NEGATIVE  HGBUR SMALL*  BILIRUBINUR NEGATIVE  KETONESUR NEGATIVE  PROTEINUR NEGATIVE  NITRITE NEGATIVE  LEUKOCYTESUR NEGATIVE      Imaging: Ct Abdomen Pelvis Wo Contrast  Result Date: 09/28/2016 CLINICAL DATA:  60 year old male with coffee-ground emesis and abdominal distention. Intubated. EXAM: CT ABDOMEN AND PELVIS WITHOUT CONTRAST TECHNIQUE: Multidetector CT imaging of the abdomen and pelvis was performed following the standard protocol without IV contrast. COMPARISON:  09/28/2016 abdominal radiographs. FINDINGS: Lower chest: Small bilateral dependent pleural effusions. The tip of a superior approach central venous catheter is seen at the cavoatrial junction. Coronary atherosclerosis. There is patchy consolidation with some volume loss in the dependent lower lobes bilaterally. Oral contrast is seen in the visualized lower thoracic esophagus. There is mildly irregular circumferential wall thickening in the visualized lower thoracic esophagus. Hepatobiliary: Atrophic liver with  diffuse surface irregularity, indicating cirrhosis. Hypodense 0.7 cm liver lesion in the right liver lobe (series 2/ image 23). No additional liver lesion. Layering sludge versus stones in the nondistended gallbladder with no definite gallbladder wall thickening. No biliary ductal dilatation. Pancreas: Normal, with no mass or duct dilation. Spleen: Normal size. No mass. Adrenals/Urinary Tract: Mild irregular thickening of both adrenal glands without discrete adrenal nodules. No hydronephrosis. No renal stones. Exophytic simple 1.0 cm renal cyst in the lateral interpolar right kidney. No additional contour deforming renal lesions. Bladder is collapsed by indwelling Foley catheter and is grossly normal. Stomach/Bowel: Oral contrast minimally distends the stomach, which appears grossly normal. Enteric tube terminates in the proximal stomach. Oral contrast progresses to the descending colon. There a few mildly dilated small bowel loops with air-fluid levels in the mid abdomen measuring up to the 3.7 cm diameter. No small bowel wall thickening. No focal small bowel caliber transition. Normal appendix . Mild sigmoid diverticulosis, with no large bowel wall thickening or significant pericolonic fat stranding. Rectal catheter is in place. Vascular/Lymphatic: Atherosclerotic nonaneurysmal abdominal aorta. No pathologically enlarged lymph nodes in the abdomen or pelvis. Reproductive: Normal size prostate. Other: No pneumoperitoneum. Large volume ascites with simple fluid density. There is ascitic fluid trapped within a small umbilical hernia. Moderate anasarca. Symmetric gynecomastia. Musculoskeletal: No aggressive appearing focal osseous lesions. Mild chronic appearing T11 vertebral compression fracture. Mild to moderate thoracolumbar spondylosis. IMPRESSION: 1. Atrophic cirrhotic liver. Indeterminate subcentimeter hypodense right liver lesion, for which short-term outpatient MRI abdomen without and with IV contrast is  recommended for further evaluation. 2. Large volume ascites.  Normal size spleen. 3. Nonspecific mildly irregular circumferential wall thickening in the lower thoracic esophagus, potentially due to varices. 4. Small dependent bilateral pleural effusions. Patchy consolidation and volume loss in the dependent lower lung lobes bilaterally, favor atelectasis, cannot exclude a component of aspiration or pneumonia. 5. Moderate anasarca. 6. Mildly dilated mid small bowel loops with air-fluid levels with no discrete small bowel caliber  transition, and with progression of oral contrast to the distal colon, most consistent with mild adynamic ileus. 7. Aortic atherosclerosis.  Coronary atherosclerosis. Electronically Signed   By: Ilona Sorrel M.D.   On: 09/28/2016 09:36   Dg Ankle Complete Left  Result Date: 09/28/2016 CLINICAL DATA:  Ulceration along the medial aspect the left ankle. EXAM: LEFT ANKLE COMPLETE - 3+ VIEW COMPARISON:  None. FINDINGS: Ulceration along the medial malleolus and medial to the distal femoral metaphysis noted. Underlying periostitis along the distal tibia medially. Mild periostitis along the distal fibula. Vascular calcifications.  Dorsal spurring of the talar head. IMPRESSION: 1. Ulceration overlying the medial malleolus, with adjacent underlying periostitis/periosteal reaction. I cannot completely exclude the possibility of early osteomyelitis given the periostitis. No bony destructive findings. There is also mild periostitis in the distal fibula. Electronically Signed   By: Van Clines M.D.   On: 09/10/2016 17:19   Dg Abd 1 View  Result Date: 09/28/2016 CLINICAL DATA:  NG tube placement. EXAM: ABDOMEN - 1 VIEW COMPARISON:  Chest radiograph yesterday. No prior abdominal imaging. FINDINGS: Dilated small bowel in the central and lower abdomen measure up to 4.7 cm in caliber. There is gaseous gastric distention. Enteric tube in place with tip and side-port in the stomach. No evidence  of pneumatosis or free air. IMPRESSION: Gaseous distention of small bowel concerning for small bowel obstruction versus ileus. Tip and side port of the enteric tube in the stomach. Electronically Signed   By: Jeb Levering M.D.   On: 09/28/2016 05:06   Ct Head Wo Contrast  Result Date: 09/10/2016 CLINICAL DATA:  Altered mental status, found down EXAM: CT HEAD WITHOUT CONTRAST TECHNIQUE: Contiguous axial images were obtained from the base of the skull through the vertex without intravenous contrast. COMPARISON:  10/15/2012 FINDINGS: Brain: No evidence of acute infarction, hemorrhage, hydrocephalus, extra-axial collection or mass lesion/mass effect. Subcortical white matter and periventricular small vessel ischemic changes. Vascular: Mild intracranial atherosclerosis. Skull: Normal. Negative for fracture or focal lesion. Sinuses/Orbits: Near complete opacification of the left maxillary sinus. Mild partial opacification of bilateral sphenoid and right maxillary sinus. Mucosal thickening of the bilateral ethmoid sinuses. Mastoid air cells are clear. Other: Mild cortical atrophy. IMPRESSION: No evidence of acute intracranial abnormality. Atrophy with small vessel ischemic changes. Paranasal sinus disease, as above. Electronically Signed   By: Julian Hy M.D.   On: 09/22/2016 16:59   US Venous Img Lower Unilateral Left  Result Date: 09/28/2016 CLINICAL DATA:  Left lower extremity edema EXAM: LEFT LOWER EXTREMITY VENOUS DOPPLER ULTRASOUND TECHNIQUE: Gray-scale sonography with graded compression, as well as color Doppler and duplex ultrasound were performed to evaluate the lower extremity deep venous systems from the level of the common femoral vein and including the common femoral, femoral, profunda femoral, popliteal and calf veins including the posterior tibial, peroneal and gastrocnemius veins when visible. The superficial great saphenous vein was also interrogated. Spectral Doppler was utilized to  evaluate flow at rest and with distal augmentation maneuvers in the common femoral, femoral and popliteal veins. COMPARISON:  None. FINDINGS: Contralateral Common Femoral Vein: Respiratory phasicity is normal and symmetric with the symptomatic side. No evidence of thrombus. Normal compressibility. Common Femoral Vein: No evidence of thrombus. Normal compressibility, respiratory phasicity and response to augmentation. Saphenofemoral Junction: No evidence of thrombus. Normal compressibility and flow on color Doppler imaging. Profunda Femoral Vein: No evidence of thrombus. Normal compressibility and flow on color Doppler imaging. Femoral Vein: No evidence of thrombus. Normal compressibility, respiratory phasicity  and response to augmentation. Popliteal Vein: No evidence of thrombus. Normal compressibility, respiratory phasicity and response to augmentation. Calf Veins: Unable to visualize or assess because of overlying bandage. Superficial Great Saphenous Vein: No evidence of thrombus. Normal compressibility and flow on color Doppler imaging. Venous Reflux:  None. Other Findings:  None. IMPRESSION: No significant left lower extremity femoral popliteal DVT. Electronically Signed   By: Jerilynn Mages.  Shick M.D.   On: 09/28/2016 10:32   US Paracentesis  Addendum Date: 09/28/2016   ADDENDUM REPORT: 09/28/2016 17:14 ADDENDUM: Correction: Lidocaine with epinephrine was not used. 1% lidocaine was used for local anesthetic. Electronically Signed   By: Markus Daft M.D.   On: 09/28/2016 17:14   Result Date: 09/28/2016 INDICATION: 60 year old with end-stage liver disease and massive ascites. Patient is intubated with altered mental status. Request for a diagnostic paracentesis. EXAM: ULTRASOUND GUIDED PARACENTESIS MEDICATIONS: None. COMPLICATIONS: None immediate. PROCEDURE: Informed consent cannot be obtained from the patient. No family is available for consent. This was discussed with the patient's physician, Amador. The  paracentesis is felt to be medically necessary. Therefore, the procedure was performed under emergency consent. Initial ultrasound scanning demonstrates a large amount of ascites within the right lower abdominal quadrant. The right lower abdomen was prepped and draped in the usual sterile fashion. 1% lidocaine with epinephrine was used for local anesthesia. Following this, a 6 Fr Safe-T-Centesis catheter was introduced. An ultrasound image was saved for documentation purposes. The paracentesis was performed. The catheter was removed and a dressing was applied. The patient tolerated the procedure well without immediate post procedural complication. FINDINGS: A total of approximately 2 L of yellow fluid was removed. Samples were sent to the laboratory as requested by the clinical team. IMPRESSION: Successful ultrasound-guided paracentesis yielding 2 liters of peritoneal fluid. Electronically Signed: By: Markus Daft M.D. On: 09/28/2016 16:51   Dg Chest Port 1 View  Result Date: 09/29/2016 CLINICAL DATA:  Respiratory failure. EXAM: PORTABLE CHEST 1 VIEW COMPARISON:  September 28, 2016 FINDINGS: Support apparatus is in stable in good position. No pneumothorax. Mild bibasilar atelectasis with no focal infiltrate or overt edema. IMPRESSION: Stable support apparatus and mild bibasilar atelectasis. Electronically Signed   By: Dorise Bullion III M.D   On: 09/29/2016 07:17   Dg Chest Port 1 View  Result Date: 09/28/2016 CLINICAL DATA:  Central line and NG tube placement. EXAM: PORTABLE CHEST 1 VIEW COMPARISON:  Chest radiograph earlier this day at 0305 hour FINDINGS: Endotracheal tube is now 2 cm from the carina. Tip and side port of the enteric tube below the diaphragm in the stomach. Right internal jugular central venous catheter tip in the mid SVC. No evidence of pneumothorax. Low lung volumes persist. Bibasilar opacities favor atelectasis. No evidence of pleural fluid. Unchanged heart size and mediastinal  contours. IMPRESSION: 1. Right central line in the mid SVC. No pneumothorax. Endotracheal tube 2 cm from the carina. 2. Low lung volumes with bibasilar opacities favoring atelectasis. Electronically Signed   By: Jeb Levering M.D.   On: 09/28/2016 05:05   Dg Chest Port 1 View  Result Date: 09/28/2016 CLINICAL DATA:  Acute respiratory failure, endotracheal tube placement. EXAM: PORTABLE CHEST 1 VIEW COMPARISON:  09/12/2016 CXR FINDINGS: The tip of an endotracheal tube seen slightly above the level of the clavicular heads approximately 6.5 cm above the carina. Low lung volumes with crowding of interstitial lung markings. No pneumonic consolidation, effusion or pneumothorax. Aortic atherosclerosis is noted. The cardiac silhouette is within normal limits for size.  IMPRESSION: Tip of endotracheal tube is approximately 6.5 cm above the carina. Slight advancement 1.5-2 cm suggested. Low lung volumes with mild interstitial prominence bilaterally. Electronically Signed   By: Ashley Royalty M.D.   On: 09/28/2016 03:34   Dg Chest Portable 1 View  Result Date: 09/11/2016 CLINICAL DATA:  Unresponsive patient, vomiting, ulcer along the ankle, altered mental status EXAM: PORTABLE CHEST 1 VIEW COMPARISON:  Report from 01/10/2000 FINDINGS: Dilated loops of small bowel in the upper abdomen measuring up to 4.8 cm. Mild airspace opacity along the left hemidiaphragm, I favor atelectasis over pneumonia. Low lung volumes.  Bony demineralization. IMPRESSION: 1. 1. Dilated small bowel loops up to 4.8 cm, otherwise nonspecific. Small bowel obstruction not excluded. Abdominal imaging suggested. 2. Indistinct airspace opacity at the left lung base, I favor atelectasis over pneumonia although the appearance is not entirely specific. Electronically Signed   By: Van Clines M.D.   On: 09/24/2016 17:17     Medications:   . fentaNYL infusion INTRAVENOUS 40 mcg/hr (09/29/16 0800)  . norepinephrine (LEVOPHED) Adult infusion  15.04 mcg/min (09/29/16 0800)  . pantoprozole (PROTONIX) infusion 8 mg/hr (09/29/16 0800)  . pureflow    . vasopressin (PITRESSIN) infusion - *FOR SHOCK* 0.03 Units/min (09/29/16 0800)   . chlorhexidine gluconate (MEDLINE KIT)  15 mL Mouth Rinse BID  . lactulose  30 g Oral BID  . mouth rinse  15 mL Mouth Rinse QID  . meropenem  1 g Intravenous Q12H  . [START ON 10/01/2016] pantoprazole  40 mg Intravenous Q12H  . rifaximin  550 mg Oral BID  . sodium chloride flush  3 mL Intravenous Q12H  . thiamine injection  100 mg Intravenous Daily  . vancomycin  750 mg Intravenous Q24H   sodium chloride, acetaminophen **OR** acetaminophen, fentaNYL, ipratropium-albuterol, [DISCONTINUED] ondansetron **OR** ondansetron (ZOFRAN) IV, sodium chloride  Assessment/ Plan:  Bryan Proctor is a 61 y.o. white male with ETOH abuse, hypertension, end stage liver disease, who was admitted to [facility] on 09/07/2016   1. Acute Renal Failure with metabolic acidosis: anuric and requiring vasopressors.  - Initiate CRRT 2K bath. Monitor platelets  2. Hyponatremia: due to volume overload  3. Anemia and thrombocytopenia: hemoglobin stable, will monitor. Concern for GI bleed.  Low platelets due to liver failure  4. End Stage Liver Disease: MELD of 28  Overall prognosis is poor.  Will give 72 hours of intensive care   LOS: 2 Taleigha Pinson, [place] 12/27/20179:44 AM

## 2016-09-29 NOTE — Progress Notes (Signed)
*  PRELIMINARY RESULTS* Echocardiogram 2D Echocardiogram has been performed.  Cristela BlueHege, Fredy Gladu 09/29/2016, 9:18 AM

## 2016-09-29 NOTE — Progress Notes (Signed)
eLink Physician-Brief Progress Note Patient Name: Bryan MohsRoger W Proctor DOB: [birth year] MRN: [id number]   Date of Service  09/29/2016  HPI/Events of Note  Dialysate pink tinged.Bleed from ETT suction line. INR = 2.45 this AM. Concern for hemolysis and/or DIC. FFP ordered and repeat PT/INR and PTT pending post transfusion.   eICU Interventions  Will order: 1. CBC with platelets and differential now.  2. Fibrinogen level now.  3. Ionized Ca++ now.      Intervention Category Major Interventions: Other:  Lenell AntuSommer,Dalayza Zambrana Eugene 09/29/2016, 7:34 PM

## 2016-09-29 NOTE — Progress Notes (Signed)
Patient roommate Renae Fickleaul, came to visit patient and brought his cell phone, and advised of sister name. Called her on patient's cell phone and advised her brother is here in hospital. She is out of country and will be returning tomorrow. She also updated her brother, who called and said he would be here tonight. Called and updated Dr. Wynelle LinkKolluru, he will speak with family members when they arrive. [place] DonovanDiane Warren- sister- [phone] (lives in [place] Baxter FlatteryJeff Meding-brother [phone] (lives in [place])

## 2016-09-29 NOTE — Progress Notes (Signed)
Pharmacy Antibiotic Note  Bryan Proctor is a 60 y.o. male admitted on Sep 04, 2016 with sepsis/Ecoli bactermia/Venous stasis ulcer infection with early L ankle osteomyelitis.  Pharmacy has been consulted for meropenem and vancomycin dosing.  Plan: Meropenem 1g IV Q12hr.    Vancomycin 1250mg  IV Q18hr for goal trough of 15-20. Will obtain trough prior to dose on 12/28.    Weight: 185 lb 3 oz (84 kg)  Temp (24hrs), Avg:98.1 F (36.7 C), Min:96.3 F (35.7 C), Max:99.1 F (37.3 C)   Recent Labs Lab 11/27/2015 1539 11/27/2015 1844  09/28/16 0538 09/28/16 0844 09/28/16 1600 09/28/16 2229 09/29/16 0511  WBC 5.4  --   < > 5.4 5.9 13.3* 14.0* 14.4*  CREATININE 1.67*  --   --  1.91*  --   --   --  2.72*  LATICACIDVEN 3.2* 2.4*  --  3.7*  --   --   --   --   < > = values in this interval not displayed.  Estimated Creatinine Clearance: 30.2 mL/min (by C-G formula based on SCr of 2.72 mg/dL (H)).    Allergies  Allergen Reactions  . Penicillins Nausea And Vomiting    Antimicrobials this admission: Aztreonam 12/25 >> 12/26 Levofloxacin 12/25 >> 12/25 Vancomycin 12/25 >>  Meropenem 12/26 >>  Dose adjustments this admission: Meropenem changed to 1 gram daily and vancomycin changed to 750 mg daily for patient now on CRRT. Vancomycin level before 12/28 dose.  Microbiology results: 12/25 BCx: Ecoli 1/2 12/25 UCx: pending  12/25 MRSA PCR: negative   Pharmacy will continue to monitor and adjust per consult.   Rossanna Spitzley S 09/29/2016 6:46 AM

## 2016-09-29 NOTE — Progress Notes (Signed)
Patient ultrafiltration from CRRT pink colored, per CRRT orders, discontinued and removed cartridge. Tried numerous times to obtain FS blood glucose, glucometer could not read blood results, even when drawn through Safe Set through [facility]. Renal panel and Magnesium sent to lab x 2, call from lab stating "blood is so hemolyzed cannot get results and that plasma looks like straight blood" Have already sent STAT CBC with platelet differential, waiting for results to call to report to Dr. Wynelle LinkKolluru.

## 2016-09-29 NOTE — Progress Notes (Signed)
CRRT med review:  All medications reviewed. Meropenem and vancomycin adjusted for CRRT.

## 2016-09-29 NOTE — Progress Notes (Signed)
Nutrition Follow-up  DOCUMENTATION CODES:   Not applicable  INTERVENTION:  -Discussed trial of trophic feedings with MD Simonds during ICU rounds. MD agreeable with starting trophic feedings, orders entered for Vital High Protein at 20 ml/hr. Assess for tolerance on follow and ability to titrate TF. If unable to tolerate, pt may benefit from addition of promotility agent such as reglan, erythromycin   NUTRITION DIAGNOSIS:   Inadequate oral intake related to acute illness as evidenced by NPO status.  Being addressed via TF  GOAL:   Provide needs based on ASPEN/SCCM guidelines  MONITOR:   Vent status, Labs, Weight trends  REASON FOR ASSESSMENT:   Consult Enteral/tube feeding initiation and management  ASSESSMENT:    Pt remains on vent support, anuric ARF requiring initation of CRRT. Pt on levophed and vasopressin. UGIB resolved  CT abdomen yesterday consistent with mild adynamic ileus; pt also with significant asictes, moderate anasarca, atrophic cirrhotic liver and esophageal varices  NG with total of 700 mL out since admission, only 200 mL during night shift. Abdomen distended but pt with significant ascites/anasarca Paracentesis yesterday with 2 L fluid removed  Labs: sodium 131, magnesium 1.5 Meds: lactulose, xifaxan  Diet Order:   NPO  Skin:  Wound (see comment) (non-pressure wounds on ankle, leg)  Last BM:  no documented BM  Height:   Ht Readings from Last 1 Encounters:  08/20/16 5\' 10"  (1.778 m)    Weight:   Wt Readings from Last 1 Encounters:  09/29/16 185 lb 3 oz (84 kg)   Filed Weights   09/09/2016 1602 09/26/2016 1943 09/29/16 0600  Weight: 200 lb (90.7 kg) 185 lb 13.6 oz (84.3 kg) 185 lb 3 oz (84 kg)    BMI:  Body mass index is 26.57 kg/m.  Estimated Nutritional Needs:   Kcal:  1731 kcals  Protein:  126-168 g  Fluid:  >/= 1.7 L  EDUCATION NEEDS:   No education needs identified at this time  Romelle StarcherCate Chandra Feger MS, RD, LDN [phone]  Pager  [phone] Weekend/On-Call Pager

## 2016-09-29 NOTE — Progress Notes (Signed)
Called to Dr. Wynelle LinkKolluru to advise of difficulty with lab draws and abnormal lab results (lab will not result because of such a difference). Advised of differences from earlier note. MD order for 2 unit FFP, STAT PTT and INR. Also called eLink MD Dr. Donette LarrySomer who is also putting in orders currently.

## 2016-09-30 ENCOUNTER — Inpatient Hospital Stay: Payer: 59

## 2016-09-30 DIAGNOSIS — R40243 Glasgow coma scale score 3-8, unspecified time: Secondary | ICD-10-CM

## 2016-09-30 DIAGNOSIS — D62 Acute posthemorrhagic anemia: Secondary | ICD-10-CM

## 2016-09-30 DIAGNOSIS — K922 Gastrointestinal hemorrhage, unspecified: Secondary | ICD-10-CM

## 2016-09-30 LAB — CBC WITH DIFFERENTIAL/PLATELET
BASOS ABS: 0 10*3/uL (ref 0–0.1)
Basophils Relative: 0 %
EOS PCT: 1 %
Eosinophils Absolute: 0.1 10*3/uL (ref 0–0.7)
HCT: 7.1 % — CL (ref 40.0–52.0)
HEMOGLOBIN: 4.1 g/dL — AB (ref 13.0–18.0)
LYMPHS ABS: 1.4 10*3/uL (ref 1.0–3.6)
Lymphocytes Relative: 10 %
MCH: UNDETERMINED pg (ref 26.0–34.0)
MCHC: UNDETERMINED g/dL (ref 32.0–36.0)
MCV: UNDETERMINED fL (ref 80.0–100.0)
Monocytes Absolute: 1.3 10*3/uL — ABNORMAL HIGH (ref 0.2–1.0)
Monocytes Relative: 9 %
NEUTROS PCT: 80 %
Neutro Abs: 11.6 10*3/uL — ABNORMAL HIGH (ref 1.4–6.5)
PLATELETS: 42 10*3/uL — AB (ref 150–440)
RBC: UNDETERMINED MIL/uL (ref 4.40–5.90)
RDW: 16.8 % — AB (ref 11.5–14.5)
WBC: 14.4 10*3/uL — AB (ref 3.8–10.6)
nRBC: 3 /100 WBC — ABNORMAL HIGH

## 2016-09-30 LAB — RENAL FUNCTION PANEL
ALBUMIN: 2 g/dL — AB (ref 3.5–5.0)
ANION GAP: 8 (ref 5–15)
Albumin: 1.9 g/dL — ABNORMAL LOW (ref 3.5–5.0)
Anion gap: 8 (ref 5–15)
BUN: 59 mg/dL — ABNORMAL HIGH (ref 6–20)
CALCIUM: 7.5 mg/dL — AB (ref 8.9–10.3)
CHLORIDE: 104 mmol/L (ref 101–111)
CO2: 17 mmol/L — ABNORMAL LOW (ref 22–32)
CO2: 18 mmol/L — ABNORMAL LOW (ref 22–32)
Calcium: 7.6 mg/dL — ABNORMAL LOW (ref 8.9–10.3)
Chloride: 104 mmol/L (ref 101–111)
Creatinine, Ser: 5.86 mg/dL — ABNORMAL HIGH (ref 0.61–1.24)
Creatinine, Ser: 4.11 mg/dL — ABNORMAL HIGH (ref 0.61–1.24)
GFR, EST AFRICAN AMERICAN: 11 mL/min — AB (ref >60)
GFR, EST AFRICAN AMERICAN: 17 mL/min — AB (ref >60)
GFR, EST NON AFRICAN AMERICAN: 9 mL/min — AB (ref >60)
GFR, EST NON AFRICAN AMERICAN: 15 mL/min — AB (ref >60)
Glucose, Bld: 78 mg/dL (ref 65–99)
Glucose, Bld: 74 mg/dL (ref 65–99)
POTASSIUM: 4.5 mmol/L (ref 3.5–5.1)
Phosphorus: 3.5 mg/dL (ref 2.5–4.6)
Potassium: 4.2 mmol/L (ref 3.5–5.1)
SODIUM: 129 mmol/L — AB (ref 135–145)
Sodium: 130 mmol/L — ABNORMAL LOW (ref 135–145)

## 2016-09-30 LAB — MAGNESIUM: MAGNESIUM: 2.6 mg/dL — AB (ref 1.7–2.4)

## 2016-09-30 LAB — HEMOGLOBIN AND HEMATOCRIT, BLOOD
HCT: 7.2 % — CL (ref 40.0–52.0)
HEMOGLOBIN: 4.1 g/dL — AB (ref 13.0–18.0)

## 2016-09-30 LAB — GLUCOSE, CAPILLARY
Glucose-Capillary: 111 mg/dL — ABNORMAL HIGH (ref 65–99)
Glucose-Capillary: 78 mg/dL (ref 65–99)
Glucose-Capillary: 80 mg/dL (ref 65–99)
Glucose-Capillary: 83 mg/dL (ref 65–99)
Glucose-Capillary: 92 mg/dL (ref 65–99)

## 2016-09-30 LAB — ABO/RH: ABO/RH(D): A POS

## 2016-09-30 LAB — CULTURE, BLOOD (ROUTINE X 2)

## 2016-09-30 LAB — PREPARE RBC (CROSSMATCH)

## 2016-09-30 LAB — PATHOLOGIST SMEAR REVIEW

## 2016-09-30 MED ORDER — SODIUM CHLORIDE 0.9 % IV SOLN: Freq: Once | INTRAVENOUS | Status: AC

## 2016-09-30 MED ORDER — SODIUM CHLORIDE 0.9 % IV SOLN
Freq: Once | INTRAVENOUS | Status: AC
  Administered 2016-09-30: 10:00:00 via INTRAVENOUS

## 2016-09-30 MED ORDER — SODIUM CHLORIDE 0.9 % IV SOLN
500.0000 mg | Freq: Two times a day (BID) | INTRAVENOUS | Status: DC
  Administered 2016-09-30: 500 mg via INTRAVENOUS
  Filled 2016-09-30 (×3): qty 0.5

## 2016-09-30 NOTE — Progress Notes (Signed)
Received call from lab. Hemoglobin 4.1 and platelets 42. Awaiting for FFP to be thawed. Paged Dr Wynelle LinkKolluru for additional orders due to lab results. Will start CRRT once transfusions are completed if MD believes patient is stable enough.

## 2016-09-30 NOTE — Progress Notes (Signed)
[facility]  ROUNDING NOTE   Subjective:   Hemolyzing blood. CRRT held due to hemolysis.   Brother at bedside.   Norepinephrine and vasopressin gtt.  Objective:  Vital signs in last 24 hours:  Temp:  [96.8 F (36 C)-99.3 F (37.4 C)] 98.3 F (36.8 C) (12/28 0700) Pulse Rate:  [63-108] 102 (12/28 0900) Resp:  [12-20] 14 (12/28 0900) BP: (88-124)/(62-81) 93/64 (12/28 0900) SpO2:  [91 %-99 %] 93 % (12/28 0900) FiO2 (%):  [30 %] 30 % (12/28 0745) Weight:  [87.3 kg (192 lb 7.4 oz)] 87.3 kg (192 lb 7.4 oz) (12/28 0500)  Weight change: 3.3 kg (7 lb 4.4 oz) Filed Weights   09/13/2016 1943 09/29/16 0600 09/30/16 0500  Weight: 84.3 kg (185 lb 13.6 oz) 84 kg (185 lb 3 oz) 87.3 kg (192 lb 7.4 oz)    Intake/Output: I/O last 3 completed shifts: In: 3829.4 [I.V.:2362.7; Blood:677; NG/GT:389.7; IV Piggyback:400] Out: 245 [Urine:45; Emesis/NG output:200]   Intake/Output this shift:  No intake/output data recorded.  Physical Exam: General: Critically ill  Head: ETT  Eyes: +icterus  Neck: Supple, trachea midline  Lungs:  Mechanical ventilation PRVC FiO2 30%  Heart: Regular rate and rhythm  Abdomen:  +ascites  Extremities: 1+ peripheral edema.  Neurologic: Off sedation and nonresponsive  Skin: No lesions  Access: Left temp HD catheter 12/26 Dr. Leonidas Romberg    Basic Metabolic Panel:  Recent Labs Lab 09/28/16 0538 09/29/16 0511 09/29/16 0849 09/30/16 0158 09/30/16 0913  NA 131* 129* 131* 129* 130*  K 4.7 4.7 4.1 4.2 4.5  CL 104 102 106 104 104  CO2 17* 19* 18* 17* 18*  GLUCOSE 76 105* 97 78 74  BUN 50* 56* 51* RESULTS UNAVAILABLE DUE TO INTERFERING SUBSTANCE 59*  CREATININE 1.91* 2.72* 2.66* 5.86* 4.11*  CALCIUM 8.4* 8.1* 7.2* 7.5* 7.6*  MG  --   --  1.5*  --   --   PHOS  --   --  2.7 RESULTS UNAVAILABLE DUE TO INTERFERING SUBSTANCE 3.5    Liver Function Tests:  Recent Labs Lab 09/18/2016 1539 09/28/16 0538 09/29/16 0511 09/29/16 0849 09/30/16 0158  09/30/16 0913  AST 77* 82* 78*  --   --   --   ALT _0 --   --   --   ALKPHOS 80 75 62  --   --   --   BILITOT 2.5* 2.8* 2.6*  --   --   --   PROT 6.5 6.0* 5.6*  --   --   --   ALBUMIN 2.0* 1.8* 2.0* 1.8* 2.0* 1.9*    Recent Labs Lab 09/05/2016 1539  LIPASE 71*    Recent Labs Lab 09/29/2016 1539 09/28/16 0844  AMMONIA 127* 173*    CBC:  Recent Labs Lab 09/05/2016 1539  09/28/16 0538 09/28/16 0844 09/28/16 1600 09/28/16 2229 09/29/16 0511  WBC 5.4  < > 5.4 5.9 13.3* 14.0* 14.4*  NEUTROABS 4.4  --   --   --   --   --   --   HGB 11.9*  < > 11.2* 10.7* 10.1* 9.1* 9.3*  HCT 33.7*  < > 31.9* 30.8* 28.8* 26.4* 27.0*  MCV 100.2*  < > 101.2* 100.1* 100.2* 99.5 101.0*  PLT 94*  < > 109* 109* 97* 77* 75*  < > = values in this interval not displayed.  Cardiac Enzymes:  Recent Labs Lab 09/30/2016 1539  CKTOTAL 239  TROPONINI <0.03    BNP: Invalid  input(s): POCBNP  CBG:  Recent Labs Lab 09/04/2016 1919 09/29/16 0326 09/29/16 1204 09/30/16 0724  GLUCAP 152* 136* 110* 5    Microbiology: Results for orders placed or performed during the hospital encounter of 09/10/2016  Blood Culture (routine x 2)     Status: Abnormal (Preliminary result)   Collection Time: 10/03/2016  3:39 PM  Result Value Ref Range Status   Specimen Description BLOOD R AC  Final   Special Requests   Final    BOTTLES DRAWN AEROBIC AND ANAEROBIC  AER 10 ANA 16ML   Culture  Setup Time   Final    Organism ID to follow GRAM NEGATIVE RODS ANAEROBIC BOTTLE ONLY CRITICAL RESULT CALLED TO, READ BACK BY AND VERIFIED WITH: MATT MCBANE ON 09/28/16 AT 0647 QSD    Culture (A)  Final    ESCHERICHIA COLI SUSCEPTIBILITIES TO FOLLOW Performed at [facility]    Report Status PENDING  Incomplete  Blood Culture (routine x 2)     Status: None (Preliminary result)   Collection Time: 09/22/2016  3:39 PM  Result Value Ref Range Status   Specimen Description BLOOD L AC  Final   Special Requests BOTTLES  DRAWN AEROBIC AND ANAEROBIC ANA 9 AER 10ML  Final   Culture NO GROWTH 3 DAYS  Final   Report Status PENDING  Incomplete  Urine culture     Status: None   Collection Time: 09/05/2016  3:39 PM  Result Value Ref Range Status   Specimen Description URINE, RANDOM  Final   Special Requests NONE  Final   Culture NO GROWTH Performed at [facility]   Final   Report Status 09/28/2016 FINAL  Final  Blood Culture ID Panel (Reflexed)     Status: Abnormal   Collection Time: 09/12/2016  3:39 PM  Result Value Ref Range Status   Enterococcus species NOT DETECTED NOT DETECTED Final   Listeria monocytogenes NOT DETECTED NOT DETECTED Final   Staphylococcus species NOT DETECTED NOT DETECTED Final   Staphylococcus aureus NOT DETECTED NOT DETECTED Final   Streptococcus species NOT DETECTED NOT DETECTED Final   Streptococcus agalactiae NOT DETECTED NOT DETECTED Final   Streptococcus pneumoniae NOT DETECTED NOT DETECTED Final   Streptococcus pyogenes NOT DETECTED NOT DETECTED Final   Acinetobacter baumannii NOT DETECTED NOT DETECTED Final   Enterobacteriaceae species DETECTED (A) NOT DETECTED Final    Comment: CRITICAL RESULT CALLED TO, READ BACK BY AND VERIFIED WITH: MATT MCBANE ON 09/28/16 AT 0647 QSD    Enterobacter cloacae complex NOT DETECTED NOT DETECTED Final   Escherichia coli DETECTED (A) NOT DETECTED Final    Comment: CRITICAL RESULT CALLED TO, READ BACK BY AND VERIFIED WITH: MATT MCBANE ON 09/28/16 AT 0647 QSD    Klebsiella oxytoca NOT DETECTED NOT DETECTED Final   Klebsiella pneumoniae NOT DETECTED NOT DETECTED Final   Proteus species NOT DETECTED NOT DETECTED Final   Serratia marcescens NOT DETECTED NOT DETECTED Final   Carbapenem resistance NOT DETECTED NOT DETECTED Final   Haemophilus influenzae NOT DETECTED NOT DETECTED Final   Neisseria meningitidis NOT DETECTED NOT DETECTED Final   Pseudomonas aeruginosa NOT DETECTED NOT DETECTED Final   Candida albicans NOT DETECTED NOT  DETECTED Final   Candida glabrata NOT DETECTED NOT DETECTED Final   Candida krusei NOT DETECTED NOT DETECTED Final   Candida parapsilosis NOT DETECTED NOT DETECTED Final   Candida tropicalis NOT DETECTED NOT DETECTED Final  MRSA PCR Screening     Status: None   Collection  Time: 10/01/2016  7:30 PM  Result Value Ref Range Status   MRSA by PCR NEGATIVE NEGATIVE Final    Comment:        The GeneXpert MRSA Assay (FDA approved for NASAL specimens only), is one component of a comprehensive MRSA colonization surveillance program. It is not intended to diagnose MRSA infection nor to guide or monitor treatment for MRSA infections.   Body fluid culture     Status: None (Preliminary result)   Collection Time: 09/28/16  3:55 PM  Result Value Ref Range Status   Specimen Description PERITONEAL  Final   Special Requests NONE  Final   Gram Stain   Final    FEW WBC PRESENT, PREDOMINANTLY MONONUCLEAR NO ORGANISMS SEEN    Culture   Final    NO GROWTH < 24 HOURS Performed at [facility]    Report Status PENDING  Incomplete    Coagulation Studies:  Recent Labs  09/30/2016 1539 09/07/2016 1844 09/29/16 0511  LABPROT 19.5* 23.7* 27.0*  INR 1.63 2.08 2.45    Urinalysis:  Recent Labs  09/30/2016 1539  COLORURINE AMBER*  LABSPEC 1.016  PHURINE 5.0  GLUCOSEU NEGATIVE  HGBUR SMALL*  BILIRUBINUR NEGATIVE  KETONESUR NEGATIVE  PROTEINUR NEGATIVE  NITRITE NEGATIVE  LEUKOCYTESUR NEGATIVE      Imaging: US Venous Img Lower Unilateral Left  Result Date: 09/28/2016 CLINICAL DATA:  Left lower extremity edema EXAM: LEFT LOWER EXTREMITY VENOUS DOPPLER ULTRASOUND TECHNIQUE: Gray-scale sonography with graded compression, as well as color Doppler and duplex ultrasound were performed to evaluate the lower extremity deep venous systems from the level of the common femoral vein and including the common femoral, femoral, profunda femoral, popliteal and calf veins including the posterior tibial,  peroneal and gastrocnemius veins when visible. The superficial great saphenous vein was also interrogated. Spectral Doppler was utilized to evaluate flow at rest and with distal augmentation maneuvers in the common femoral, femoral and popliteal veins. COMPARISON:  None. FINDINGS: Contralateral Common Femoral Vein: Respiratory phasicity is normal and symmetric with the symptomatic side. No evidence of thrombus. Normal compressibility. Common Femoral Vein: No evidence of thrombus. Normal compressibility, respiratory phasicity and response to augmentation. Saphenofemoral Junction: No evidence of thrombus. Normal compressibility and flow on color Doppler imaging. Profunda Femoral Vein: No evidence of thrombus. Normal compressibility and flow on color Doppler imaging. Femoral Vein: No evidence of thrombus. Normal compressibility, respiratory phasicity and response to augmentation. Popliteal Vein: No evidence of thrombus. Normal compressibility, respiratory phasicity and response to augmentation. Calf Veins: Unable to visualize or assess because of overlying bandage. Superficial Great Saphenous Vein: No evidence of thrombus. Normal compressibility and flow on color Doppler imaging. Venous Reflux:  None. Other Findings:  None. IMPRESSION: No significant left lower extremity femoral popliteal DVT. Electronically Signed   By: Jerilynn Mages.  Shick M.D.   On: 09/28/2016 10:32   US Paracentesis  Addendum Date: 09/28/2016   ADDENDUM REPORT: 09/28/2016 17:14 ADDENDUM: Correction: Lidocaine with epinephrine was not used. 1% lidocaine was used for local anesthetic. Electronically Signed   By: Markus Daft M.D.   On: 09/28/2016 17:14   Result Date: 09/28/2016 INDICATION: 60 year old with end-stage liver disease and massive ascites. Patient is intubated with altered mental status. Request for a diagnostic paracentesis. EXAM: ULTRASOUND GUIDED PARACENTESIS MEDICATIONS: None. COMPLICATIONS: None immediate. PROCEDURE: Informed consent  cannot be obtained from the patient. No family is available for consent. This was discussed with the patient's physician, [place]. The paracentesis is felt to be medically necessary. Therefore, the  procedure was performed under emergency consent. Initial ultrasound scanning demonstrates a large amount of ascites within the right lower abdominal quadrant. The right lower abdomen was prepped and draped in the usual sterile fashion. 1% lidocaine with epinephrine was used for local anesthesia. Following this, a 6 Fr Safe-T-Centesis catheter was introduced. An ultrasound image was saved for documentation purposes. The paracentesis was performed. The catheter was removed and a dressing was applied. The patient tolerated the procedure well without immediate post procedural complication. FINDINGS: A total of approximately 2 L of yellow fluid was removed. Samples were sent to the laboratory as requested by the clinical team. IMPRESSION: Successful ultrasound-guided paracentesis yielding 2 liters of peritoneal fluid. Electronically Signed: By: Markus Daft M.D. On: 09/28/2016 16:51   Dg Chest Port 1 View  Result Date: 09/30/2016 CLINICAL DATA:  Respiratory failure EXAM: PORTABLE CHEST 1 VIEW COMPARISON:  09/29/2016; 09/28/2016; 09/05/2016 FINDINGS: Grossly unchanged cardiac silhouette and mediastinal contours given persistently reduced lung volumes. Stable position of support apparatus. No pneumothorax. Pulmonary vasculature remains indistinct with cephalization of flow. Slight worsening of perihilar heterogeneous airspace opacities, right greater than left. Trace pleural effusions are not excluded. Unchanged bones. IMPRESSION: 1.  Stable positioning of support apparatus.  No pneumothorax. 2. Worsening perihilar heterogeneous opacities, potentially atelectasis though additional differential considerations include asymmetric alveolar pulmonary edema and multifocal infection. Continued attention on follow-up is  recommended. Electronically Signed   By: Sandi Mariscal M.D.   On: 09/30/2016 07:45   Dg Chest Port 1 View  Result Date: 09/29/2016 CLINICAL DATA:  Respiratory failure. EXAM: PORTABLE CHEST 1 VIEW COMPARISON:  September 28, 2016 FINDINGS: Support apparatus is in stable in good position. No pneumothorax. Mild bibasilar atelectasis with no focal infiltrate or overt edema. IMPRESSION: Stable support apparatus and mild bibasilar atelectasis. Electronically Signed   By: Dorise Bullion III M.D   On: 09/29/2016 07:17     Medications:   . norepinephrine (LEVOPHED) Adult infusion 5.013 mcg/min (09/30/16 0700)  . pureflow 3 each (09/29/16 0810)  . vasopressin (PITRESSIN) infusion - *FOR SHOCK* Stopped (09/30/16 0612)   . chlorhexidine gluconate (MEDLINE KIT)  15 mL Mouth Rinse BID  . famotidine (PEPCID) IV  20 mg Intravenous Q24H  . feeding supplement (VITAL HIGH PROTEIN)  1,000 mL Per Tube Q24H  . lactulose  30 g Oral BID  . mouth rinse  15 mL Mouth Rinse QID  . meropenem  1 g Intravenous Q12H  . phytonadione (VITAMIN K) IV  10 mg Intravenous Daily  . rifaximin  550 mg Oral BID  . sodium chloride flush  3 mL Intravenous Q12H  . thiamine injection  100 mg Intravenous Daily   sodium chloride, acetaminophen **OR** acetaminophen, fentaNYL (SUBLIMAZE) injection, heparin, ipratropium-albuterol, [DISCONTINUED] ondansetron **OR** ondansetron (ZOFRAN) IV, sodium chloride  Assessment/ Plan:  Mr. Bryan Proctor is a 60 y.o. white male with ETOH abuse, hypertension, end stage liver disease, who was admitted to [facility] on 09/17/2016   1. Acute Renal Failure with metabolic acidosis: anuric and requiring vasopressors.  - Initiate CRRT 2K bath when more stable.  - Overall prognosis is poor.   2. Hyponatremia: due to volume overload  3. Anemia and thrombocytopenia: no new labs due to hemolysis - FFP ordered Low platelets due to liver failure  4. End Stage Liver Disease: MELD of 28  Overall prognosis is  poor.  Discussed case with family in detail.    LOS: [place], [place] 12/28/20179:49 AM

## 2016-09-30 NOTE — Progress Notes (Signed)
Paged Dr Wynelle LinkKolluru, patient has had 2 units of packed red blood cells and 1 units of FFP. Orders for an additional unit  but patient sounds rhonchi with only 10 ml of urine output. At this point Dr Wynelle LinkKolluru stated to hold off an additional unit of FFP and to hold off on CRRT.

## 2016-09-30 NOTE — Progress Notes (Signed)
Spoke with Dr Bard HerbertSimmonds

## 2016-09-30 NOTE — Progress Notes (Signed)
Per Dr. Bard HerbertSimmonds we will not recheck H&H after transfusions or any other labs at this point Dr. Wynelle LinkKolluru in agreement. Per Dr Ronn MelenaKolloru discontinued labs Metb and magnesium since patient is no longer on CRRT.

## 2016-09-30 NOTE — Progress Notes (Signed)
PULMONARY / CRITICAL CARE MEDICINE   Name: Bryan MohsRoger W Proctor MRN: [id number] DOB: May 31, 1956    ADMISSION DATE:  09/07/2016 CONSULTATION DATE:  09/28/2016  REFERRING MD:  Dr. Emmit PomfretHugelmeyer   CHIEF COMPLAINT:  Altered Mental Status  PT PROFILE: 60 y.o. M with multiple medical problems including alcohol abuse and cirrhosis found in a pool of coffee ground emesis and initially admitted by Hospitalists for UGIB and AMS. He appeared to be labored in his breathing and was ultimately intubated for this and severe obtundation. Working diagnoses of shock due to GIB and/or sepsis with MODS including AKI.   MAJOR EVENTS/TEST RESULTS: 12/25 Admitted to Hospitalist service. [facility]25[facility] head: No evidence of acute intracranial abnormality. Atrophy with small vessel ischemic changes. 12/25 L ankle Xray: Ulceration overlying the medial malleolus, with adjacent underlying periostitis/periosteal reaction. Possible of early osteomyelitis 12/26 Nephrology consultation 12/26 CTAP: Atrophic cirrhotic liver. Large volume ascites. Nonspecific mildly irregular circumferential wall thickening in the lower thoracic esophagus, potentially due to varices. Moderate anasarca. Mildly dilated mid small bowel loops with air-fluid levels with no discrete small bowel caliber transition, and with progression of oral contrast to the distal colon, most consistent with mild adynamic ileus. 12/26 Increasing vasopressor requirements. Vasopressin added 12/26 @ liters removed by paracentesis. Fluid not c/w SBP (WBC 37, albumin < 1.0)  12/27 remains comatose, CRRT initiated 12/27 Echocardiogram: The cavity size was mildly dilated. There was severe concentric hypertrophy. Systolic function was normal. The estimated ejection fraction was 65%. 12/28 Difficulty with CRRT - machine clotting.  12/28 Severe anemia (Hgb 4.1). Unclear source of blood loss. 2 units PRBCs ordered after confirmation of  H/H 12/28 Pt's brother has been reached and goals of care conversation undertaken. Severity of illness conveyed, poor baseline health status acknowledged. Pt was made DNR with no escalation of care planned. Pt's sister is returning from out of country this evening. Plan for discussion with brother and sister 12/29 AM to consider withdrawal vs continued, time-limited trial of current support. It was conveyed that if there is no sign of significant improvement in next 2-3 days, further support would be futile  INDWELLING DEVICES:: R IJ CVL 12/26 >>  ETT 12/26 >>  L femoral HD cath 12/26 >>   MICRO DATA: MRSA PCR 12/25 >> NEG Urine 12/25 >> NEG Blood 12/25 >> 1/2 E coli Peritoneal fluid 12/26 >> NEG  ANTIMICROBIALS:  Vancomycin 12/26 >> 12/27 Meropenem 12/26 >>    SUBJECTIVE:  RASS -5 off all sedation. No spontaneous movement  VITAL SIGNS: BP 93/64   Pulse (!) 102   Temp 98.3 F (36.8 C)   Resp 14   Wt 192 lb 7.4 oz (87.3 kg)   SpO2 93%   BMI 27.62 kg/m   HEMODYNAMICS: CVP:  [14 mmHg-22 mmHg] 15 mmHg  VENTILATOR SETTINGS: Vent Mode: PRVC FiO2 (%):  [30 %-50 %] 50 % Set Rate:  [15 bmp] 15 bmp Vt Set:  [500 mL] 500 mL PEEP:  [5 cmH20] 5 cmH20  INTAKE / OUTPUT: I/O last 3 completed shifts: In: 3829.4 [I.V.:2362.7; Blood:677; NG/GT:389.7; IV Piggyback:400] Out: 245 [Urine:45; Emesis/NG output:200]  PHYSICAL EXAMINATION: General:  RASS -5, not F/C Neuro:  PERRL, no spontaneous movement, no withdrawal HEENT:  NCAT Cardiovascular:  Tachy, reg, no M Lungs: few rhonchi, no wheezes Abdomen:  Distended, , + tympani, absent BS Musculoskeletal:  4+ bilateral lower extremity pitting edema Skin:  LLE ulceration 4 x 3 centimeters above medial malleolus  LABS:  BMET  Recent Labs Lab 09/29/16 0849 09/30/16 0158 09/30/16 0913  NA 131* 129* 130*  K 4.1 4.2 4.5  CL 106 104 104  CO2 18* 17* 18*  BUN 51* RESULTS UNAVAILABLE DUE TO INTERFERING SUBSTANCE 59*   CREATININE 2.66* 5.86* 4.11*  GLUCOSE 97 78 74    Electrolytes  Recent Labs Lab 09/29/16 0849 09/30/16 0158 09/30/16 0913  CALCIUM 7.2* 7.5* 7.6*  MG 1.5*  --  2.6*  PHOS 2.7 RESULTS UNAVAILABLE DUE TO INTERFERING SUBSTANCE 3.5    CBC  Recent Labs Lab 09/28/16 2229 09/29/16 0511 09/30/16 1000  WBC 14.0* 14.4* 14.4*  HGB 9.1* 9.3* 4.1*  HCT 26.4* 27.0* 7.1*  PLT 77* 75* 42*    Coag's  Recent Labs Lab 10-13-15 1539 10-13-15 1844 09/29/16 0511  APTT 46*  --  51*  INR 1.63 2.08 2.45    Sepsis Markers  Recent Labs Lab 10-13-15 1539 10-13-15 1844 09/28/16 0538  LATICACIDVEN 3.2* 2.4* 3.7*  PROCALCITON  --  0.86  --     ABG  Recent Labs Lab 10-13-15 1535 09/28/16 0243  PHART 7.42 7.32*  PCO2ART 29* 34  PO2ART 94 80*    Liver Enzymes  Recent Labs Lab 10-13-15 1539 09/28/16 0538 09/29/16 0511 09/29/16 0849 09/30/16 0158 09/30/16 0913  AST 77* 82* 78*  --   --   --   ALT 31 30 29   --   --   --   ALKPHOS 80 75 62  --   --   --   BILITOT 2.5* 2.8* 2.6*  --   --   --   ALBUMIN 2.0* 1.8* 2.0* 1.8* 2.0* 1.9*    Cardiac Enzymes  Recent Labs Lab 10-13-15 1539  TROPONINI <0.03    Glucose  Recent Labs Lab 10-13-15 1919 09/29/16 0326 09/29/16 1204 09/30/16 0724  GLUCAP 152* 136* 110* 78    CXR: bibasilar atx/inf  ASSESSMENT / PLAN:  PULMONARY A: Acute respiratory failure due to AMS P:   Cont full vent support - settings reviewed and/or adjusted Cont vent bundle Daily SBT if/when meets criteria Follow CXR intermittently  CARDIOVASCULAR A:  Septic shock Hemorrhagic shock Lactic acidosis Hx: HTN P:  Cont NE/vasopressin to maintain MAP > 65 mmHg DNR if arrests  RENAL A:   AKI, anuric Metabolic acidosis Mild hyponatremia P:   Monitor BMET intermittently Monitor I/Os Correct electrolytes as indicated CRRT per Renal Service  GASTROINTESTINAL A:   Severe cirrhosis Severe ascites UGIB Adynamic ileus P:    SUP: famotidine IV Begin trickle feeds 12/27  HEMATOLOGIC A:   Acute blood loss anemia  Thrombocytopenia Coagulopathy of liver disease P:  DVT px: SCDs Monitor CBC intermittently Transfuse per usual guidelines Vitamin K X 3 doses beginning 12/27 FFP X 2 units ordered 12/28 2 units RBCs ordered 12/28  INFECTIOUS A:   Severe sepsis E coli bacteremia  Venous stasis ulcer infection with early L ankle osteomyelitis P:   Monitor temp, WBC count Micro and abx as above  ENDOCRINE A:   High risk of hypoglycemia due to liver and renal disease P:   Monitor serum glucose q 4 hrs Avoid insulin unless CBG > 200  NEUROLOGIC A:   Hepatic and septic encephalopathy  Coma P:   RASS goal: 0 to -1 WUA daily Continue Lactulose and rifaximin    FAMILY  Please see documentation above. Plan to meet with pt's brother and sister in AM 12/29. I offered that it would be reasonable  to withdraw all support at that time. At most, would not continue aggressive support for more than 2-3 more days unless substantial improvement is noted. Pt is now DNR if he arrests  CCM time: 50 mins The above time includes time spent in consultation with patient and/or family members and reviewing care plan on multidisciplinary rounds  Billy Fischer, MD PCCM service Mobile [phone] Pager [phone]

## 2016-09-30 NOTE — Progress Notes (Signed)
Sent down lab draw for H&H. Per Dr Bard HerbertSimmonds transfuse PRBC then FFP. Awaiting for lab to prepare PRBC. Per Dr Wynelle LinkKolluru at this point patient is too unstable for CRRT.

## 2016-09-30 NOTE — Progress Notes (Signed)
Per Annabelle Harmanana, NP draw labs again at 0600. Labs drawn, but once again labs hemolyzed. Annabelle Harmanana, NP aware. Trudee KusterBrandi R Mansfield

## 2016-09-30 NOTE — Progress Notes (Signed)
Still unable to to draw blood and result without it becoming hemolyzed, notified by lab after line and venipuncture draw. Annabelle Harmanana, NP notified. No new orders currently. Trudee KusterBrandi R Mansfield

## 2016-09-30 NOTE — Progress Notes (Signed)
Pharmacy Antibiotic Note  Bryan Proctor is a 60 y.o. male admitted on 09/05/2016 with sepsis/Ecoli bactermia/Venous stasis ulcer infection with early L ankle osteomyelitis.  Pharmacy has been consulted for antibiotic adjustment for CRRT. Per nephrology, pt is too unstable for any dialysis so CRRT has been stopped. No HD planned. Possible comfort care.  Plan: Will transition patient from meropenem 1g IV Q12hr to meropenem 500 mg IV q 12 hours based on CrCl. SCr ordered with AM labs to follow renal function. Ecoli [name] pansensitive, will attempt to de-escalate.  Vanc has been d/c  Weight: 192 lb 7.4 oz (87.3 kg)  Temp (24hrs), Avg:98.4 F (36.9 C), Min:97.3 F (36.3 C), Max:99.3 F (37.4 C)   Recent Labs Lab 09/12/2016 1539 09/05/2016 1844  09/28/16 0538 09/28/16 0844 09/28/16 1600 09/28/16 2229 09/29/16 0511 09/29/16 0849 09/30/16 0158 09/30/16 0913 09/30/16 1000  WBC 5.4  --   < > 5.4 5.9 13.3* 14.0* 14.4*  --   --   --  14.4*  CREATININE 1.67*  --   --  1.91*  --   --   --  2.72* 2.66* 5.86* 4.11*  --   LATICACIDVEN 3.2* 2.4*  --  3.7*  --   --   --   --   --   --   --   --   < > = values in this interval not displayed.  Estimated Creatinine Clearance: 20 mL/min (by C-G formula based on SCr of 4.11 mg/dL (H)).    Allergies  Allergen Reactions  . Penicillins Nausea And Vomiting    Antimicrobials this admission: Aztreonam 12/25 >> 12/26 Levofloxacin 12/25 >> 12/25 Vancomycin 12/25 >> 12/26 Meropenem 12/26 >>  Dose adjustments this admission: Meropenem changed to 1 gram daily and vancomycin changed to 750 mg daily for patient now on CRRT.  12/28 Meropenem changed from 1 g q 12 to 500 mg q 12  Microbiology results: 12/25 BCx: Ecoli pansensitive  12/25 UCx: NG 12/25 MRSA PCR: negative  12/26 body fluid cx: pending NG x 2 days  Pharmacy will continue to monitor and adjust per consult.   Bryan Proctor, PharmD Pharmacy Resident 09/30/2016 4:07 PM

## 2016-09-30 NOTE — Progress Notes (Signed)
Nutrition Follow-up  DOCUMENTATION CODES:   Not applicable  INTERVENTION:  -Discussed nutritional poc during ICU rounds; [facility], will continue trophic feedings only at present with no orders to titrate. Continue to assess  NUTRITION DIAGNOSIS:   Inadequate oral intake related to acute illness as evidenced by NPO status.  Continues but being addressed via trophic feedings  GOAL:   Provide needs based on ASPEN/SCCM guidelines  MONITOR:   Vent status, Labs, Weight trends  REASON FOR ASSESSMENT:   Consult Enteral/tube feeding initiation and management  ASSESSMENT:    Pt remains on vent support, CRRT held due to hemolysis. Levophed and vasopressin conitnues  Tolerating Vital High Protein at rate of 20 ml/hr  Labs: sodium 129, Hgb 4.1 Meds: lactulose  Diet Order:   NPO  Skin:  Wound (see comment) (non-pressure wounds on ankle, leg)  Last BM:  no documented BM  Height:   Ht Readings from Last 1 Encounters:  08/20/16 5\' 10"  (1.778 m)    Weight:   Wt Readings from Last 1 Encounters:  09/30/16 192 lb 7.4 oz (87.3 kg)   Filed Weights   09/30/2016 1943 09/29/16 0600 09/30/16 0500  Weight: 185 lb 13.6 oz (84.3 kg) 185 lb 3 oz (84 kg) 192 lb 7.4 oz (87.3 kg)    BMI:  Body mass index is 27.62 kg/m.  Estimated Nutritional Needs:   Kcal:  1731 kcals  Protein:  126-168 g  Fluid:  >/= 1.7 L  EDUCATION NEEDS:   No education needs identified at this time  Romelle StarcherCate Niki Cosman MS, RD, LDN [phone] Pager  [phone] Weekend/On-Call Pager

## 2016-10-01 DIAGNOSIS — J9601 Acute respiratory failure with hypoxia: Secondary | ICD-10-CM

## 2016-10-01 DIAGNOSIS — Z515 Encounter for palliative care: Secondary | ICD-10-CM

## 2016-10-01 DIAGNOSIS — K7031 Alcoholic cirrhosis of liver with ascites: Secondary | ICD-10-CM

## 2016-10-01 DIAGNOSIS — K117 Disturbances of salivary secretion: Secondary | ICD-10-CM

## 2016-10-01 DIAGNOSIS — Z66 Do not resuscitate: Secondary | ICD-10-CM

## 2016-10-01 LAB — PREPARE FRESH FROZEN PLASMA
UNIT DIVISION: 0
Unit division: 0
Unit division: 0

## 2016-10-01 LAB — TYPE AND SCREEN
ABO/RH(D): A POS
Antibody Screen: NEGATIVE
UNIT DIVISION: 0
Unit division: 0
Unit division: 0
Unit division: 0

## 2016-10-01 LAB — GLUCOSE, CAPILLARY
Glucose-Capillary: 105 mg/dL — ABNORMAL HIGH (ref 65–99)
Glucose-Capillary: 103 mg/dL — ABNORMAL HIGH (ref 65–99)

## 2016-10-01 MED ORDER — MORPHINE SULFATE (PF) 4 MG/ML IV SOLN
2.0000 mg | INTRAVENOUS | Status: DC | PRN
  Administered 2016-10-01 (×3): 2 mg via INTRAVENOUS
  Filled 2016-10-01 (×2): qty 1

## 2016-10-01 MED ORDER — LORAZEPAM 2 MG/ML IJ SOLN
2.0000 mg | INTRAMUSCULAR | Status: DC | PRN
  Administered 2016-10-01: 2 mg via INTRAVENOUS
  Filled 2016-10-01: qty 1

## 2016-10-01 MED ORDER — MORPHINE SULFATE (PF) 4 MG/ML IV SOLN: 2.0000 mg | INTRAVENOUS | Status: DC | PRN

## 2016-10-01 MED ORDER — FAMOTIDINE 20 MG PO TABS: 20.0000 mg | ORAL_TABLET | Freq: Every day | ORAL | Status: DC

## 2016-10-01 MED ORDER — SCOPOLAMINE 1 MG/3DAYS TD PT72
1.0000 | MEDICATED_PATCH | TRANSDERMAL | Status: DC
  Administered 2016-10-01: 19:00:00 1.5 mg via TRANSDERMAL
  Filled 2016-10-01: qty 1

## 2016-10-01 MED ORDER — LORAZEPAM 2 MG/ML IJ SOLN: 2.0000 mg | INTRAMUSCULAR | Status: DC | PRN

## 2016-10-01 MED ORDER — GLYCOPYRROLATE 0.2 MG/ML IJ SOLN
0.2000 mg | Freq: Four times a day (QID) | INTRAMUSCULAR | Status: DC
  Administered 2016-10-01 (×2): 0.2 mg via INTRAVENOUS
  Filled 2016-10-01 (×3): qty 1

## 2016-10-01 NOTE — Progress Notes (Signed)
PULMONARY / CRITICAL CARE MEDICINE   Name: Bryan Proctor MRN: [id number] DOB: [birth year]    ADMISSION DATE:  2016-01-08 CONSULTATION DATE:  09/28/2016  REFERRING MD:  Dr. Emmit PomfretHugelmeyer   CHIEF COMPLAINT:  Altered Mental Status  PT PROFILE: 60 y.o. M with multiple medical problems including alcohol abuse and cirrhosis found in a pool of coffee ground emesis and initially admitted by Hospitalists for UGIB and AMS. He appeared to be labored in his breathing and was ultimately intubated for this and severe obtundation. Working diagnoses of shock due to GIB and/or sepsis with MODS including AKI.   MAJOR EVENTS/TEST RESULTS: 12/25 Admitted to Hospitalist service. [facility] 12/25CT head: No evidence of acute intracranial abnormality. Atrophy with small vessel ischemic changes. 12/25 L ankle Xray: Ulceration overlying the medial malleolus, with adjacent underlying periostitis/periosteal reaction. Possible of early osteomyelitis 12/26 Nephrology consultation 12/26 CTAP: Atrophic cirrhotic liver. Large volume ascites. Nonspecific mildly irregular circumferential wall thickening in the lower thoracic esophagus, potentially due to varices. Moderate anasarca. Mildly dilated mid small bowel loops with air-fluid levels with no discrete small bowel caliber transition, and with progression of oral contrast to the distal colon, most consistent with mild adynamic ileus. 12/26 Increasing vasopressor requirements. Vasopressin added 12/26 @ liters removed by paracentesis. Fluid not c/w SBP (WBC 37, albumin < 1.0)  12/27 remains comatose, CRRT initiated 12/27 Echocardiogram: The cavity size was mildly dilated. There was severe concentric hypertrophy. Systolic function was normal. The estimated ejection fraction was 65%. 12/28 Difficulty with CRRT - machine clotting.  12/28 Severe anemia (Hgb 4.1). Unclear source of blood loss. 2 units PRBCs ordered after confirmation of  H/H 12/28 Pt's brother has been reached and goals of care conversation undertaken. Severity of illness conveyed, poor baseline health status acknowledged. Pt was made DNR with no escalation of care planned. Pt's sister is returning from out of country this evening. Plan for discussion with brother and sister 12/29 AM to consider withdrawal vs continued, time-limited trial of current support. It was conveyed that if there is no sign of significant improvement in next 2-3 days, further support would be futile  INDWELLING DEVICES:: R IJ CVL 12/26 >>  ETT 12/26 >>  L femoral HD cath 12/26 >>   MICRO DATA: MRSA PCR 12/25 >> NEG Urine 12/25 >> NEG Blood 12/25 >> 1/2 E coli Peritoneal fluid 12/26 >> NEG  ANTIMICROBIALS:  Vancomycin 12/26 >> 12/27 Meropenem 12/26 >>    SUBJECTIVE:  RASS -5 off all sedation. No spontaneous movement  VITAL SIGNS: BP 94/71   Pulse 85   Temp 99 F (37.2 C) (Rectal)   Resp 14   Wt 87.3 kg (192 lb 7.4 oz)   SpO2 94%   BMI 27.62 kg/m   HEMODYNAMICS: CVP:  [12 mmHg-19 mmHg] 13 mmHg  VENTILATOR SETTINGS: Vent Mode: PRVC FiO2 (%):  [30 %-50 %] 50 % Set Rate:  [15 bmp] 15 bmp Vt Set:  [500 mL] 500 mL PEEP:  [5 cmH20] 5 cmH20  INTAKE / OUTPUT: I/O last 3 completed shifts: In: 2840.9 [I.V.:689.2; Blood:1662; NG/GT:389.7; IV Piggyback:100] Out: 45 [Urine:45]  PHYSICAL EXAMINATION: General:  RASS -5, not F/C Neuro:  PERRL, no spontaneous movement, no withdrawal HEENT:  NCAT Cardiovascular:  Tachy, reg, no M Lungs:scattered rhonchi, no wheezes, crackles, rhonchi noted Abdomen:  Distended, , + tympani, absent BS Musculoskeletal:  4+ bilateral lower extremity pitting edema Skin:  LLE ulceration 4 x 3 centimeters above medial malleolus  LABS:  BMET  Recent Labs Lab 09/29/16 0849 09/30/16 0158 09/30/16 0913  NA 131* 129* 130*  K 4.1 4.2 4.5  CL 106 104 104  CO2 18* 17* 18*  BUN 51* RESULTS UNAVAILABLE DUE TO INTERFERING SUBSTANCE 59*   CREATININE 2.66* 5.86* 4.11*  GLUCOSE 97 78 74    Electrolytes  Recent Labs Lab 09/29/16 0849 09/30/16 0158 09/30/16 0913  CALCIUM 7.2* 7.5* 7.6*  MG 1.5*  --  2.6*  PHOS 2.7 RESULTS UNAVAILABLE DUE TO INTERFERING SUBSTANCE 3.5    CBC  Recent Labs Lab 09/28/16 2229 09/29/16 0511 09/30/16 1000 09/30/16 1126  WBC 14.0* 14.4* 14.4*  --   HGB 9.1* 9.3* 4.1* 4.1*  HCT 26.4* 27.0* 7.1* 7.2*  PLT 77* 75* 42*  --     Coag's  Recent Labs Lab 09/16/2016 1539 09/21/2016 1844 09/29/16 0511  APTT 46*  --  51*  INR 1.63 2.08 2.45    Sepsis Markers  Recent Labs Lab 09/26/2016 1539 09/11/2016 1844 09/28/16 0538  LATICACIDVEN 3.2* 2.4* 3.7*  PROCALCITON  --  0.86  --     ABG  Recent Labs Lab 09/21/2016 1535 09/28/16 0243  PHART 7.42 7.32*  PCO2ART 29* 34  PO2ART 94 80*    Liver Enzymes  Recent Labs Lab 09/26/2016 1539 09/28/16 0538 09/29/16 0511 09/29/16 0849 09/30/16 0158 09/30/16 0913  AST 77* 82* 78*  --   --   --   ALT 31 30 29   --   --   --   ALKPHOS 80 75 62  --   --   --   BILITOT 2.5* 2.8* 2.6*  --   --   --   ALBUMIN 2.0* 1.8* 2.0* 1.8* 2.0* 1.9*    Cardiac Enzymes  Recent Labs Lab 09/17/2016 1539  TROPONINI <0.03    Glucose  Recent Labs Lab 09/29/16 1204 09/30/16 0724 09/30/16 1146 09/30/16 1641 09/30/16 2002 09/30/16 2353  GLUCAP 110* 78 83 92 80 111*    CXR: bibasilar atx/inf  ASSESSMENT / PLAN:  PULMONARY A: Acute respiratory failure due to AMS P:   Cont full vent support - settings reviewed and/or adjusted Cont vent bundle Daily SBT if/when meets criteria Follow CXR intermittently  CARDIOVASCULAR A:  Septic shock- resoled(off pressors) Hemorrhagic shock Lactic acidosis Hx: HTN P:  DNR if arrests  RENAL A:   AKI, anuric Metabolic acidosis hyponatremia P:   Monitor BMET intermittently Monitor I/Os Correct electrolytes as indicated  GASTROINTESTINAL A:   Severe cirrhosis Severe  ascites UGIB Adynamic ileus P:   SUP: famotidine IV Begin trickle feeds 12/27  HEMATOLOGIC A:   Acute blood loss anemia  Thrombocytopenia Coagulopathy of liver disease P:  DVT px: SCDs Monitor CBC intermittently Transfuse per usual guidelines Vitamin K X 3 doses beginning 12/27  received FFP X 2 unitsd 12/28  received 2 units RBCs  12/28  INFECTIOUS A:   Severe sepsis E coli bacteremia  Venous stasis ulcer infection with early L ankle osteomyelitis P:   Monitor temp, WBC count Micro and abx as above Continue Meropenem  ENDOCRINE A:   High risk of hypoglycemia due to liver and renal disease P:   Monitor serum glucose q 4 hrs Avoid insulin unless CBG > 200  NEUROLOGIC A:   Hepatic and septic encephalopathy  Coma P:   RASS goal: 0 to -1 WUA daily Continue Lactulose and rifaximin  Continue Thiamine    Berkeley Veldman,AG-ACNP Pulmonary & Critical Care

## 2016-10-01 NOTE — Progress Notes (Signed)
Chaplain rounded the unit to provide a compassionate presence and spiritual support to the patient and family. Patient who had been placed on comfort care appeared to be sleeping during visit and prayer of comfort. Jefm PettyChaplain Syanna Remmert [phone]

## 2016-10-01 NOTE — Clinical Social Work Note (Signed)
CSW made aware that patient has been made comfort care at this time. Patient is reportedly actively dying. Pastoral care with family.  York SpanielMonica Vinny Taranto MSW,LCSW [phone]

## 2016-10-01 NOTE — Progress Notes (Signed)
Pt actively dying. Brother and sister in room.  CH offered prayer and checked on hospitality cart. CH is available.   10/01/16 1100  Clinical Encounter Type  Visited With Patient and family together  Visit Type Patient actively dying  Referral From Nurse  Spiritual Encounters  Spiritual Needs Prayer;Emotional  Stress Factors  Family Stress Factors Loss

## 2016-10-01 NOTE — Discharge Summary (Signed)
DEATH SUMMARY PULMONARY / CRITICAL CARE MEDICINE   Name: Bryan Proctor MRN: [id number] DOB: [birth year]    ADMISSION DATE:  10/03/2016 CONSULTATION DATE:  09/28/2016  REFERRING MD:  Dr. Ara Kussmaul   CHIEF COMPLAINT:  Altered Mental Status  PT PROFILE: 60 y.o. M with multiple medical problems including alcohol abuse and cirrhosis found in a pool of coffee ground emesis and initially admitted by Hospitalists for UGIB and AMS. He appeared to be labored in his breathing and was ultimately intubated for this and severe obtundation. Working diagnoses of shock due to GIB and/or sepsis with MODS including AKI.   MAJOR EVENTS/TEST RESULTS: 12/25 Admitted to Hospitalist service. Subsequently intubated and transferred to [facility] service 12/25CT head: No evidence of acute intracranial abnormality. Atrophy with small vessel ischemic changes. 12/25 L ankle Xray: Ulceration overlying the medial malleolus, with adjacent underlying periostitis/periosteal reaction. Possible of early osteomyelitis 12/26 Nephrology consultation 12/26 CTAP: Atrophic cirrhotic liver. Large volume ascites. Nonspecific mildly irregular circumferential wall thickening in the lower thoracic esophagus, potentially due to varices. Moderate anasarca. Mildly dilated mid small bowel loops with air-fluid levels with no discrete small bowel caliber transition, and with progression of oral contrast to the distal colon, most consistent with mild adynamic ileus. 12/26 Increasing vasopressor requirements. Vasopressin added 12/26 @ liters removed by paracentesis. Fluid not c/w SBP (WBC 37, albumin < 1.0)  12/27 remains comatose, CRRT initiated 12/27 Echocardiogram: The cavity size was mildly dilated. There was severe concentric hypertrophy. Systolic function was normal. The estimated ejection fraction was 65%. 12/28 Difficulty with CRRT - machine clotting.  12/28 Severe anemia (Hgb 4.1). Unclear source of blood loss. 2 units PRBCs ordered after  confirmation of H/H 12/28 Pt's brother has been reached and goals of care conversation undertaken. Severity of illness conveyed, poor baseline health status acknowledged. Pt was made DNR with no escalation of care planned. Pt's sister is returning from out of country this evening. Plan for discussion with brother and sister 12/29 AM to consider withdrawal vs continued, time-limited trial of current support. no sign of significant improvement     Admitting dx/death dx Severe cirrhosis Severe ascites UGIB Adynamic ileus Severe sepsis E coli bacteremia  Hepatic and septic encephalopathy      Patient with multiorgan failure-liver,renal,resp Prognosis is very poor  Palliative care met with family and the family has agreed and consented to [place] measures.

## 2016-10-01 NOTE — Consult Note (Signed)
Consultation Note Date: 10/01/2016   Patient Name: Bryan Proctor  DOB: [birth year]  MRN: [id number]  Age / Sex: 60 y.o., male  PCP: Birdie Sons, MD Referring Physician: Wilhelmina Mcardle, MD  Reason for Consultation: Establishing goals of care, Psychosocial/spiritual support and Withdrawal of life-sustaining treatment  HPI/Patient Profile: 60 y.o. male admitted on 09/07/2016 with  a known history of Alcoholism who is found by his friend on the floor. Patient unable to provide any information given mental status medical condition. He brought to the hospital he was noted that he was surrounded by vomitus which appeared to be blood tinged. He was markedly hypothermic on arrival to the emergency department temperature in the mid 80s  MAJOR EVENTS/TEST RESULTS: 12/25 Admitted to Hospitalist service. Subsequently intubated and transferred to [facility] service 12/25CT head: No evidence of acute intracranial abnormality. Atrophy with small vessel ischemic changes. 12/25 L ankle Xray: Ulceration overlying the medial malleolus, with adjacent underlying periostitis/periosteal reaction. Possible of early osteomyelitis 12/26 Nephrology consultation 12/26 CTAP: Atrophic cirrhotic liver. Large volume ascites. Nonspecific mildly irregular circumferential wall thickening in the lower thoracic esophagus, potentially due to varices. Moderate anasarca. Mildly dilated mid small bowel loops with air-fluid levels with no discrete small bowel caliber transition, and with progression of oral contrast to the distal colon, most consistent with mild adynamic ileus. 12/26 Increasing vasopressor requirements. Vasopressin added 12/26 @ liters removed by paracentesis. Fluid not c/w SBP (WBC 37, albumin < 1.0)  12/27 remains comatose, CRRT initiated 12/27 Echocardiogram: The cavity size was mildly dilated. There was severe concentric hypertrophy.  Systolic function was normal. The estimated ejection fraction was 65%. 12/28 Difficulty with CRRT - machine clotting.  12/28 Severe anemia (Hgb 4.1). Unclear source of blood loss. 2 units PRBCs ordered after confirmation of H/H 12/28 Pt's brother has been reached and goals of care conversation undertaken. Severity of illness conveyed, poor baseline health status acknowledged.  Family face advanced directive decisions  Clinical Assessment and Goals of Care:    This NP Wadie Lessen reviewed medical records, received report from team, assessed the patient and then meet at the patient's bedside along with his brother and sister  to discuss diagnosis, prognosis, GOC, EOL wishes disposition and options.  A detailed discussion was had today regarding advanced directives.  Concepts specific to code status, artifical feeding and hydration, continued IV antibiotics and rehospitalization was had.  The difference between a aggressive medical intervention path  and a palliative comfort care path for this patient at this time was had.  Discussed likely trajectory of a one way extubation  Values and goals of care important to patient and family were attempted to be elicited.  Concept of Hospice and Palliative Care were discussed  Natural trajectory and expectations at EOL were discussed.  Questions and concerns addressed.   Family encouraged to call with questions or concerns.  PMT will continue to support holistically.   HCPOA/ sister and brother together     SUMMARY OF RECOMMENDATIONS    Code Status/Advance Care Planning:  DNR   Symptom Management:   Pain/Dyspnea:   IV every 1 hr prn  Agitation: Ativan IV every 2 hrs prn  Terminal secretions: Robinol  Palliative Prophylaxis:   Aspiration, Bowel Regimen, Delirium Protocol, Frequent Pain Assessment and Oral Care  Additional Recommendations (Limitations, Scope, Preferences):  Full Comfort Care  Psycho-social/Spiritual:   Desire for  further Chaplaincy support:no  Additional Recommendations: Grief/Bereavement Support  Prognosis:   Hours - Days  Discharge Planning: Anticipated Hospital Death      Primary Diagnoses: Present on Admission: . Hypothermia   I have reviewed the medical record, interviewed the patient and family, and examined the patient. The following aspects are pertinent.  Past Medical History:  Diagnosis Date  . ADHD (attention deficit hyperactivity disorder)   . Alcoholism ([place])   . Hypertension   . Insomnia    Social History   Social History  . Marital status: Single    Spouse name: N/A  . Number of children: N/A  . Years of education: N/A   Occupational History  . Unemployed    Social History Main Topics  . Smoking status: Former Research scientist (life sciences)  . Smokeless tobacco: Never Used  . Alcohol use 0.0 oz/week     Comment: Previous Heavy alcohol use, drank 6-12 beers daily. Quit drinking 01/04/2015.   . Drug use:      Comment: uses Marijuana  . Sexual activity: Not Asked   Other Topics Concern  . None   Social History Narrative  . None   Family History  Problem Relation Age of Onset  . Hypertension Mother    Scheduled Meds: . chlorhexidine gluconate (MEDLINE KIT)  15 mL Mouth Rinse BID  . famotidine (PEPCID) IV  20 mg Intravenous Q24H  . feeding supplement (VITAL HIGH PROTEIN)  1,000 mL Per Tube Q24H  . lactulose  30 g Oral BID  . mouth rinse  15 mL Mouth Rinse QID  . meropenem (MERREM) IV  500 mg Intravenous Q12H  . phytonadione (VITAMIN K) IV  10 mg Intravenous Daily  . rifaximin  550 mg Oral BID  . sodium chloride flush  3 mL Intravenous Q12H  . thiamine injection  100 mg Intravenous Daily   Continuous Infusions: . norepinephrine (LEVOPHED) Adult infusion Stopped (09/30/16 1345)  . vasopressin (PITRESSIN) infusion - *FOR SHOCK* Stopped (09/30/16 0612)   PRN Meds:.sodium chloride, acetaminophen **OR** acetaminophen, fentaNYL (SUBLIMAZE) injection, heparin,  ipratropium-albuterol, [DISCONTINUED] ondansetron **OR** ondansetron (ZOFRAN) IV, sodium chloride Medications Prior to Admission:  Prior to Admission medications   Medication Sig Start Date End Date Taking? Authorizing Provider  diazepam (VALIUM) 5 MG tablet Take 1 tablet (5 mg total) by mouth at bedtime as needed for anxiety. Patient not taking: Reported on 09/28/2016 11/17/15   Birdie Sons, MD  folic acid (FOLVITE) 1 MG tablet Take 1 mg by mouth daily. Reported on 11/17/2015    Historical Provider, MD  LORazepam (ATIVAN) 1 MG tablet TAKE ONE TABLET BY MOUTH EVERY 6 HOURS AS NEEDED FOR INSOMNIA, NERVOUSNESS AND ELEVATED BLOOD PRESSURE Patient not taking: Reported on 09/28/2016 12/17/15   Birdie Sons, MD  metoprolol tartrate (LOPRESSOR) 25 MG tablet Take 1 tablet (25 mg total) by mouth 2 (two) times daily. Patient not taking: Reported on 09/28/2016 05/19/15   Birdie Sons, MD  spironolactone (ALDACTONE) 50 MG tablet Take 1 tablet (50 mg total) by mouth daily. 08/20/16   Birdie Sons, MD  thiamine 100 MG tablet Take 100 mg by mouth daily. Reported on 11/17/2015  Historical Provider, MD   Allergies  Allergen Reactions  . Penicillins Nausea And Vomiting   Review of Systems  Unable to perform ROS: Intubated  Endocrine: Positive for cold intolerance.    Physical Exam  Constitutional: He appears cachectic. He appears ill. He is intubated.  Cardiovascular: Normal rate, regular rhythm and normal heart sounds.   Pulmonary/Chest: He is intubated.  Neurological: He is unresponsive.  Skin: Skin is warm and dry.  -jaundice     Vital Signs: BP 93/63   Pulse 86   Temp 99 F (37.2 C) (Rectal)   Resp 14   Wt 89.4 kg (197 lb 1.5 oz)   SpO2 95%   BMI 28.28 kg/m  Pain Assessment: CPOT       SpO2: SpO2: 95 % O2 Device:SpO2: 95 % O2 Flow Rate: .O2 Flow Rate (L/min): 2 L/min  IO: Intake/output summary:  Intake/Output Summary (Last 24 hours) at 10/01/16 0803 Last data filed at  10/01/16 0600  Gross per 24 hour  Intake          3794.14 ml  Output               15 ml  Net          3779.14 ml    LBM:   Baseline Weight: Weight: 90.7 kg (200 lb) Most recent weight: Weight: 89.4 kg (197 lb 1.5 oz)      Palliative Assessment/Data: 10%   Discussed with Dr Mortimer Fries   Time In: 0800 Time Out: 0915 Time Total: 75 min Greater than 50%  of this time was spent counseling and coordinating care related to the above assessment and plan.  Signed by: Wadie Lessen, NP   Please contact Palliative Medicine Team phone at [phone] for questions and concerns.  For individual provider: See Shea Evans

## 2016-10-01 NOTE — Progress Notes (Signed)
Patient abd taut and distended, more so than the beginning of shift. Bowel sounds still active, feeds at 20 mL/hr. Bincy, NP notified. Per Bincy, NP d/c creatinine. Trudee KusterBrandi R Mansfield

## 2016-10-01 NOTE — Progress Notes (Signed)
Nutrition Brief Note  Chart reviewed. Pt now transitioning to comfort care. Plan for terminal extubation, TF discontinued No further nutrition interventions warranted at this time.  Please re-consult as needed.   Romelle Starcherate Jammie Clink MS, RD, LDN [phone] Pager  [phone] Weekend/On-Call Pager

## 2016-10-01 NOTE — Progress Notes (Signed)
Patient made comfort care, extubated to 2lnc per Dr.Kasa's request for comfort.

## 2016-10-01 NOTE — Consult Note (Signed)
WOC Nurse wound follow up Wound type:Patient is end of life care and comfort only.  Will keep Unnas boots in place now.  They have been in place since Tuesday and will be fine for weekly dressing changes.  Will modify orders.  Dressing procedure/placement/frequency: Decrease Unnas boots to weekly.  Comfort care only.  Will not follow at this time.  Please re-consult if needed.  Maple HudsonKaren Sonam Wandel RN BSN CWON Pager [phone]

## 2016-10-02 LAB — BODY FLUID CULTURE: Culture: NO GROWTH

## 2016-10-02 LAB — CULTURE, BLOOD (ROUTINE X 2): CULTURE: NO GROWTH

## 2016-10-03 LAB — CYTOLOGY - NON PAP

## 2016-10-04 NOTE — Discharge Summary (Signed)
  DEATH SUMMARY   Name: Bryan Proctor MRN:   [id number] DOB:   [birth year]           ADMISSION DATE:  09/04/2016 CONSULTATION DATE:  09/28/2016  REFERRING MD:  Dr. Emmit PomfretHugelmeyer   CHIEF COMPLAINT:  Altered Mental Status   Bryan Proctor was 61 y.o. M with multiple medical problems including alcohol abuse and cirrhosis, Hypertension.  [facility] as he was noted to be surrounded by vomitus that appeared to be blood tinged.Patient appeared to be labored in his breathing and was ultimately intubated for respiratory distress and severe obtundation.  Patient was in shock secondary to GIB with sepsis multiple organ failure including AKI. Over the course of his hospitalization his condition progressively worsened and patient was made a DNR with no escalation of care. On 12/29 Family members decided to withdraw care as there was no improvement in patient's condition and despite all our efforts his health status was deteriorating.  Patient was transferred to Med Surg floor.  Patient passed away on 12/30 at 01:40 am.  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

## 2016-10-04 NOTE — Progress Notes (Signed)
Pt had just deceased.  Brother and sister in room. CH offered prayer.   09/25/2016 0145  Clinical Encounter Type  Visited With Patient and family together;Health care provider  Visit Type Death  Referral From Nurse  Spiritual Encounters  Spiritual Needs Prayer;Emotional;Grief support  Stress Factors  Patient Stress Factors None identified  Family Stress Factors Exhausted;Loss

## 2016-10-04 NOTE — Progress Notes (Addendum)
Pt expired at 0140 and was verified by said nurse and Cindra PresumeJackie Page RN, charge nurse.  Brother and sister at the bedside.  Chaplain called, AC and MD notified.  Donor services contacted.  Pt does not qualify for donor services.

## 2016-10-04 DEATH — deceased

## 2016-10-05 ENCOUNTER — Telehealth: Payer: Self-pay | Admitting: Pulmonary Disease

## 2016-10-05 LAB — CALCIUM, IONIZED

## 2016-10-05 NOTE — Telephone Encounter (Signed)
Received death certificate and placed in the Pulmonology Clinic for Dr. Sung AmabileSimonds.

## 2018-03-08 IMAGING — CT CT HEAD W/O CM
3 series · 15 of 47 positions shown, 18 images · non-contrast
Comparison: 10/15/2012

CLINICAL DATA: Altered mental status, found down

EXAM:
CT HEAD WITHOUT CONTRAST
TECHNIQUE: Contiguous axial images were obtained from the base of the skull
through the vertex without intravenous contrast.

[Series 2: head wo · axial · 0.45mm/px · z∈[-87,+43]mm · 9 of 32 slices shown, 12 images]
[im 3/32  brain]
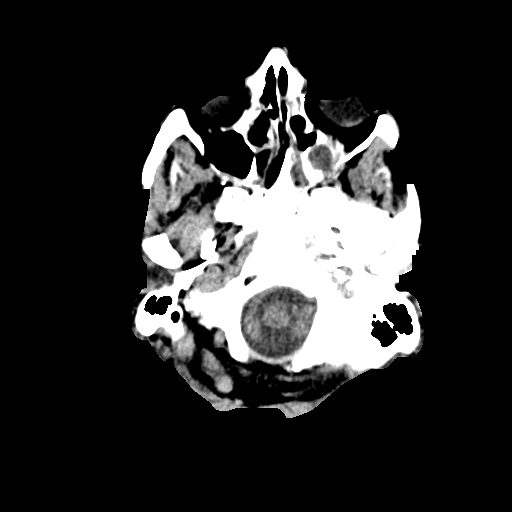
[im 3/32  bone]
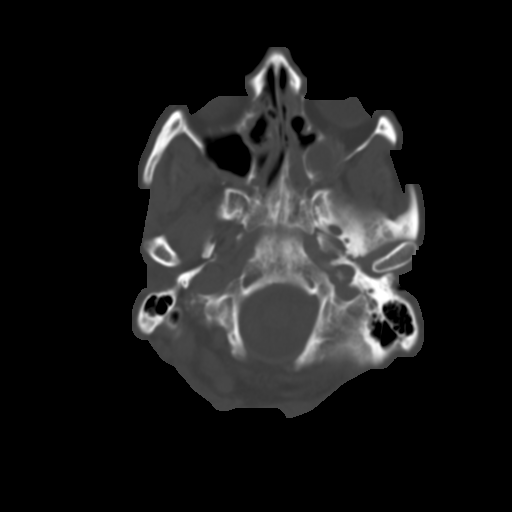
[im 6/32  brain]
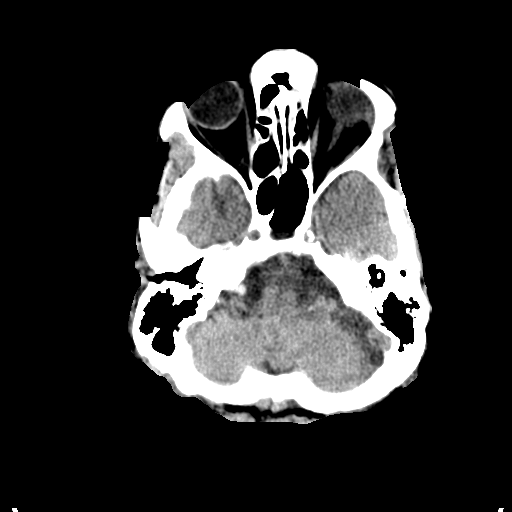
[im 9/32  brain]
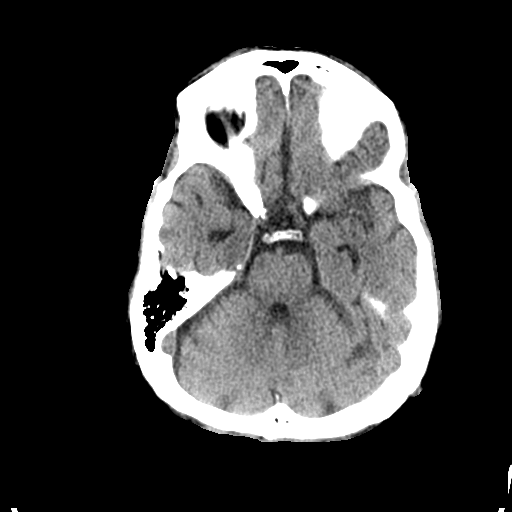
[im 12/32  brain]
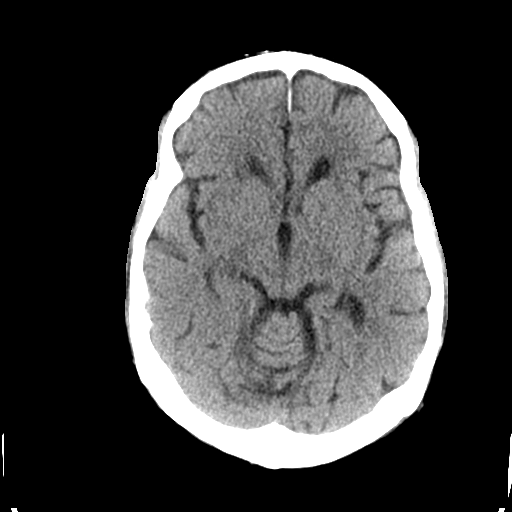
[im 17/32  brain]
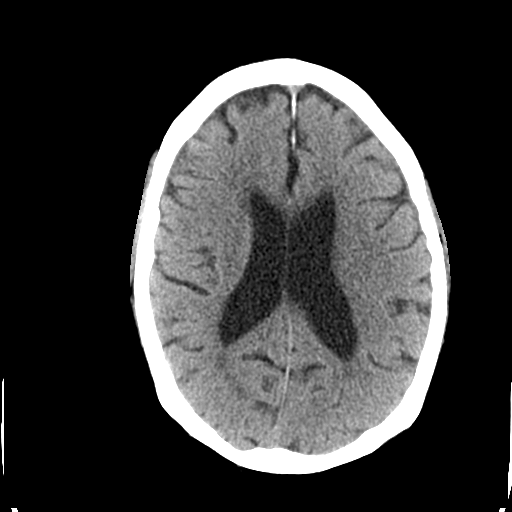
[im 17/32  bone]
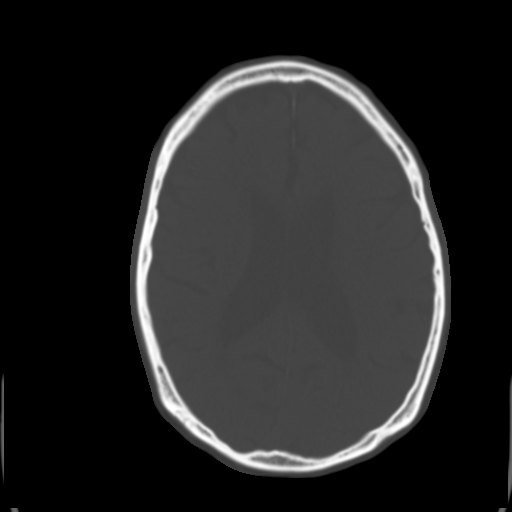
[im 20/32  brain]
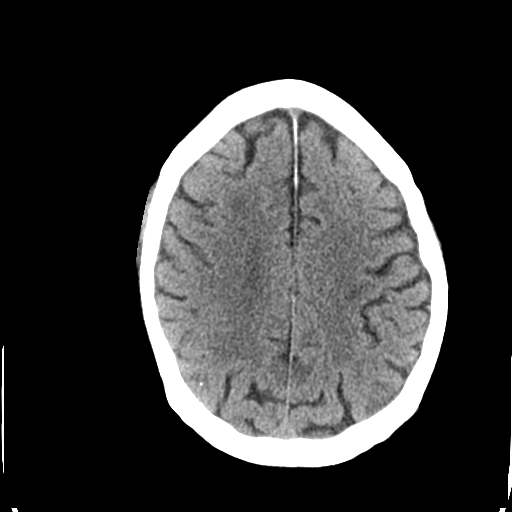
[im 23/32  brain]
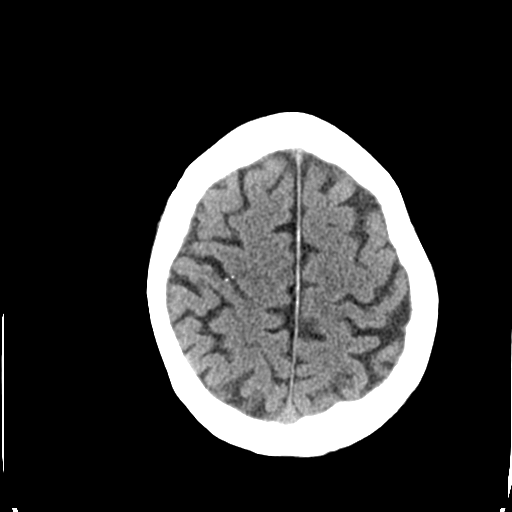
[im 26/32  brain]
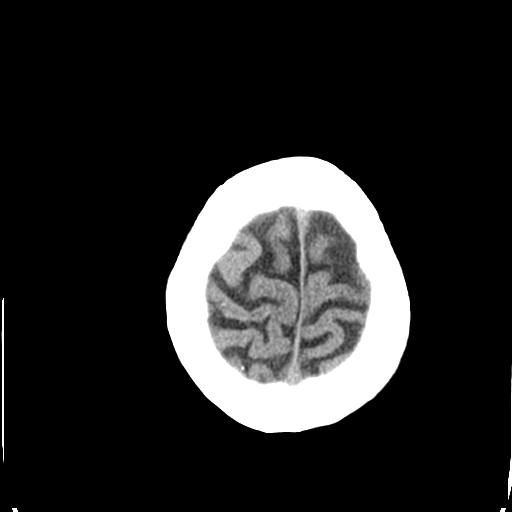
[im 29/32  brain]
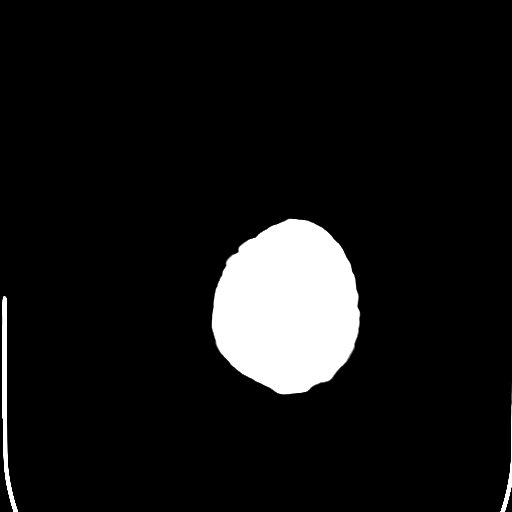
[im 29/32  bone]
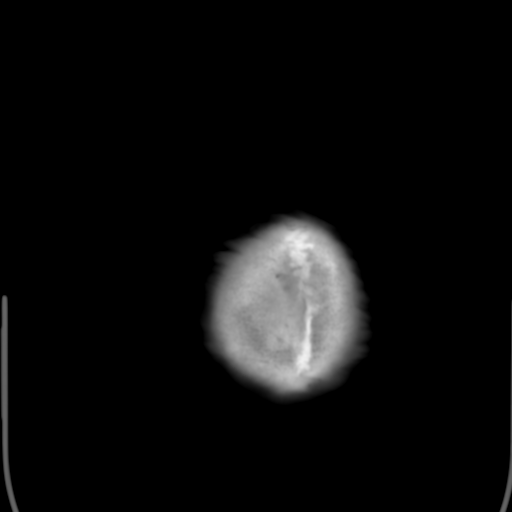

[Series 4: coronal soft tissue · coronal · 0.32mm/px · 3 of 64 slices shown]
[im 22/64  brain]
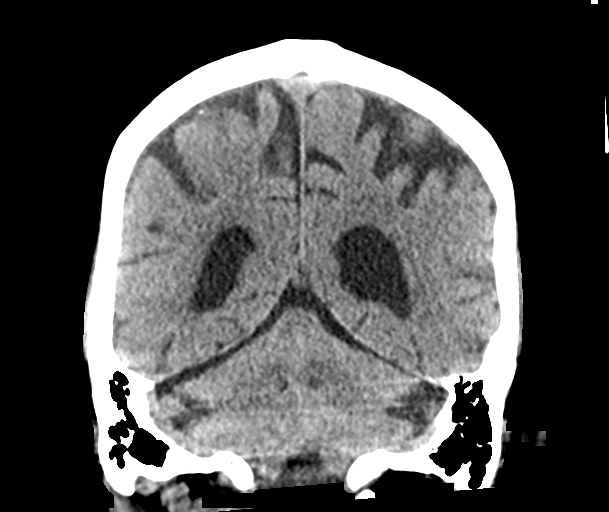
[im 29/64  brain]
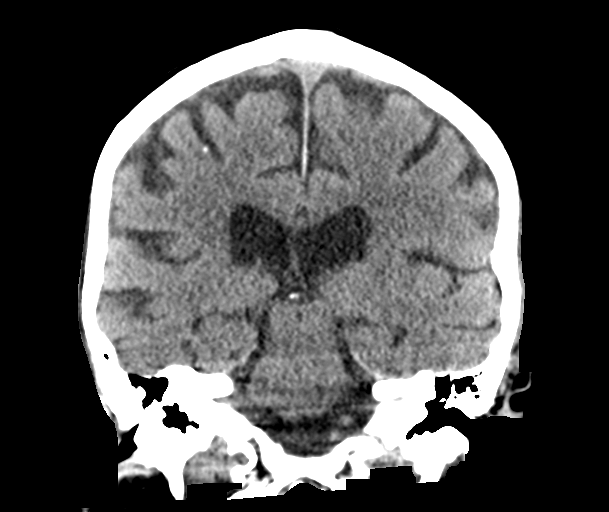
[im 36/64  brain]
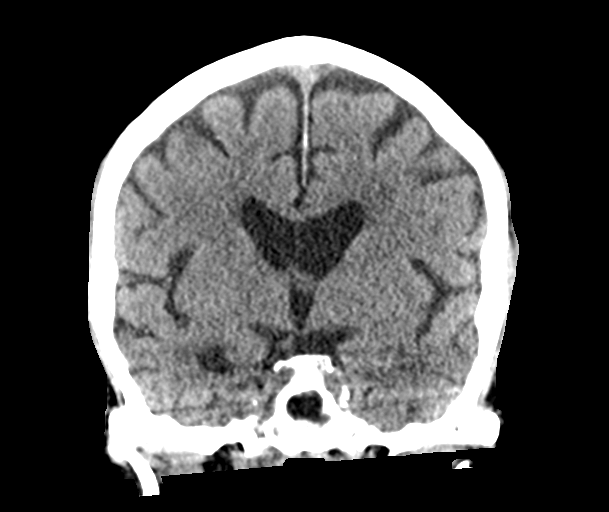

[Series 5: sagittal soft tissue · sagittal · 0.31mm/px · 3 of 48 slices shown]
[im 16/48  brain]
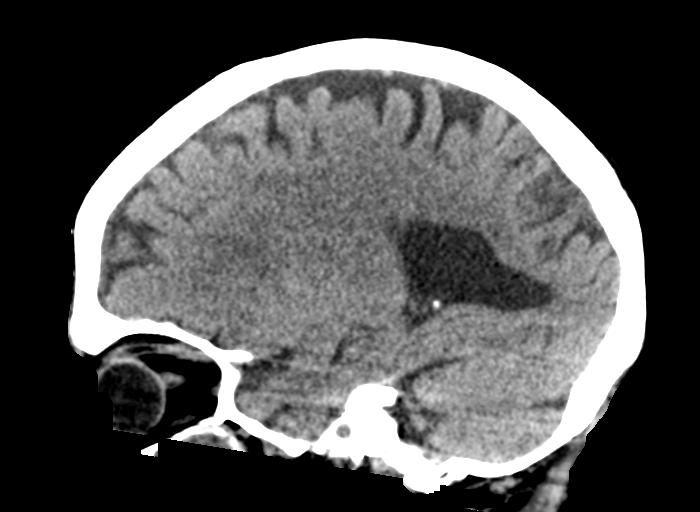
[im 24/48  brain]
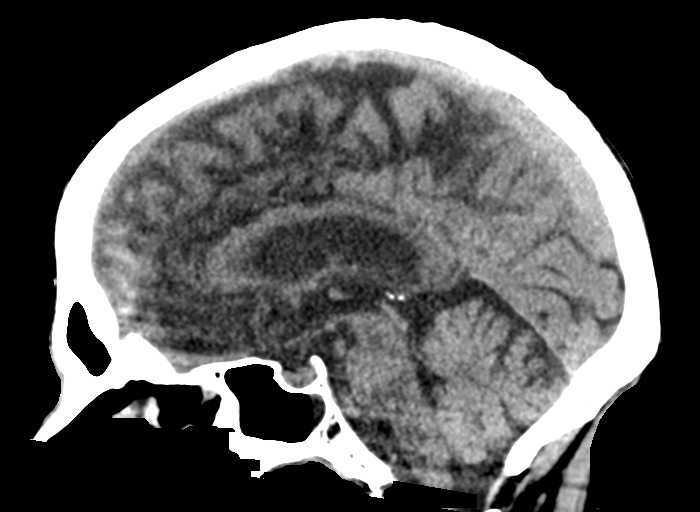
[im 32/48  brain]
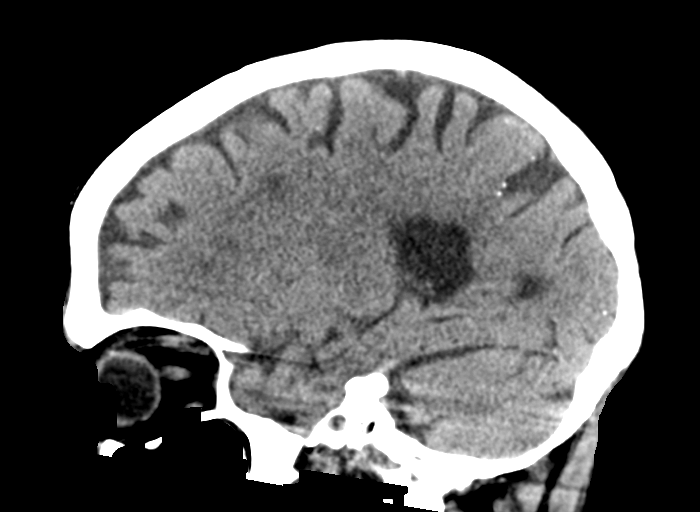

[15 of 47 positions shown; findings below may reference images not displayed]

FINDINGS: Brain: No evidence of acute infarction, hemorrhage, hydrocephalus,
extra-axial collection or mass lesion/mass effect.

Subcortical white matter and periventricular small vessel ischemic
changes.

Vascular: Mild intracranial atherosclerosis.

Skull: Normal. Negative for fracture or focal lesion.

Sinuses/Orbits: Near complete opacification of the left maxillary
sinus. Mild partial opacification of bilateral sphenoid and right
maxillary sinus. Mucosal thickening of the bilateral ethmoid
sinuses.

Mastoid air cells are clear.

Other: Mild cortical atrophy.
IMPRESSION: No evidence of acute intracranial abnormality.

Atrophy with small vessel ischemic changes.

Paranasal sinus disease, as above.
